# Patient Record
Sex: Female | Born: 1980 | Race: Black or African American | Hispanic: No | Marital: Single | Smoking: Current every day smoker
Health system: Southern US, Community
[De-identification: ages and names within clinical notes are randomized; demographics above are authoritative.]

## PROBLEM LIST (undated history)

## (undated) DIAGNOSIS — F419 Anxiety disorder, unspecified: Secondary | ICD-10-CM

## (undated) DIAGNOSIS — T7840XA Allergy, unspecified, initial encounter: Secondary | ICD-10-CM

## (undated) HISTORY — PX: TONSILLECTOMY AND ADENOIDECTOMY: SUR1326

## (undated) HISTORY — PX: WISDOM TOOTH EXTRACTION: SHX21

## (undated) HISTORY — DX: Allergy, unspecified, initial encounter: T78.40XA

---
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
## 2007-05-10 ENCOUNTER — Observation Stay: Payer: Self-pay | Admitting: Obstetrics and Gynecology

## 2007-05-10 ENCOUNTER — Ambulatory Visit: Payer: Self-pay | Admitting: Obstetrics and Gynecology

## 2007-05-12 ENCOUNTER — Inpatient Hospital Stay: Payer: Self-pay | Admitting: Obstetrics and Gynecology

## 2007-05-24 ENCOUNTER — Inpatient Hospital Stay: Payer: Self-pay | Admitting: Obstetrics and Gynecology

## 2016-08-20 LAB — HM PAP SMEAR: HM Pap smear: NEGATIVE

## 2019-04-07 ENCOUNTER — Ambulatory Visit: Payer: BLUE CROSS/BLUE SHIELD | Admitting: Physician Assistant

## 2019-04-07 ENCOUNTER — Encounter: Payer: Self-pay | Admitting: Physician Assistant

## 2019-04-07 ENCOUNTER — Other Ambulatory Visit: Payer: Self-pay

## 2019-04-07 VITALS — BP 105/64 | HR 73 | Temp 97.8°F | Resp 16 | Ht 64.0 in | Wt 175.8 lb

## 2019-04-07 DIAGNOSIS — F419 Anxiety disorder, unspecified: Secondary | ICD-10-CM

## 2019-04-07 DIAGNOSIS — Z1329 Encounter for screening for other suspected endocrine disorder: Secondary | ICD-10-CM

## 2019-04-07 DIAGNOSIS — Z130 Encounter for screening for diseases of the blood and blood-forming organs and certain disorders involving the immune mechanism: Secondary | ICD-10-CM

## 2019-04-07 DIAGNOSIS — Z131 Encounter for screening for diabetes mellitus: Secondary | ICD-10-CM

## 2019-04-07 DIAGNOSIS — E785 Hyperlipidemia, unspecified: Secondary | ICD-10-CM

## 2019-04-07 DIAGNOSIS — R140 Abdominal distension (gaseous): Secondary | ICD-10-CM

## 2019-04-07 DIAGNOSIS — Z114 Encounter for screening for human immunodeficiency virus [HIV]: Secondary | ICD-10-CM

## 2019-04-07 HISTORY — DX: Anxiety disorder, unspecified: F41.9

## 2019-04-07 MED ORDER — SERTRALINE HCL 50 MG PO TABS: 50.0000 mg | ORAL_TABLET | Freq: Every day | ORAL | 0 refills | Status: DC

## 2019-04-07 NOTE — Patient Instructions (Addendum)
Fiber Content in Foods    See the following list for the dietary fiber content of some common foods.  High-fiber foods  High-fiber foods contain 4 grams or more (4g or more) of fiber per serving. They include:  · Artichoke (fresh) -- 1 medium has 10.3g of fiber.  · Baked beans, plain or vegetarian (canned) -- ½ cup has 5.2g of fiber.  · Blackberries or raspberries (fresh) -- ½ cup has 4g of fiber.  · Bran cereal -- ½ cup has 8.6g of fiber.  · Bulgur (cooked) -- ½ cup has 4g of fiber.  · Kidney beans (canned) -- ½ cup has 6.8g of fiber.  · Lentils (cooked) -- ½ cup has 7.8g of fiber.  · Pear (fresh) -- 1 medium has 5.1g of fiber.  · Peas (frozen) -- ½ cup has 4.4g of fiber.  · Pinto beans (canned) -- ½ cup has 5.5g of fiber.  · Pinto beans (dried and cooked) -- ½ cup has 7.7g of fiber.  · Potato with skin (baked) -- 1 medium has 4.4g of fiber.  · Quinoa (cooked) -- ½ cup has 5g of fiber.  · Soybeans (canned, frozen, or fresh) -- ½ cup has 5.1g of fiber.  Moderate-fiber foods  Moderate-fiber foods contain 1-4 grams (1-4g) of fiber per serving. They include:  · Almonds -- 1 oz. has 3.5g of fiber.  · Apple with skin -- 1 medium has 3.3g of fiber.  · Applesauce, sweetened -- ½ cup has 1.5g of fiber.  · Bagel, plain -- one 4-inch (10-cm) bagel has 2g of fiber.  · Banana -- 1 medium has 3.1g of fiber.  · Broccoli (cooked) -- ½ cup has 2.5g of fiber.  · Carrots (cooked) -- ½ cup has 2.3g of fiber.  · Corn (canned or frozen) -- ½ cup has 2.1g of fiber.  · Corn tortilla -- one 6-inch (15-cm) tortilla has 1.5g of fiber.  · Green beans (canned) -- ½ cup has 2g of fiber.  · Instant oatmeal -- ½ cup has about 2g of fiber.  · Long-grain brown rice (cooked) -- 1 cup has 3.5g of fiber.  · Macaroni, enriched (cooked) -- 1 cup has 2.5g of fiber.  · Melon -- 1 cup has 1.4g of fiber.  · Multigrain cereal -- ½ cup has about 2-4g of fiber.  · Orange -- 1 small has 3.1g of fiber.  · Potatoes, mashed -- ½ cup has 1.6g of fiber.  · Raisins  -- 1/4 cup has 1.6g of fiber.  · Squash -- ½ cup has 2.9g of fiber.  · Sunflower seeds -- ¼ cup has 1.1g of fiber.  · Tomato -- 1 medium has 1.5g of fiber.  · Vegetable or soy patty -- 1 has 3.4g of fiber.  · Whole-wheat bread -- 1 slice has 2g of fiber.  · Whole-wheat spaghetti -- ½ cup has 3.2g of fiber.  Low-fiber foods  Low-fiber foods contain less than 1 gram (less than 1g) of fiber per serving. They include:  · Egg -- 1 large.  · Flour tortilla -- one 6-inch (15-cm) tortilla.  · Fruit juice -- ½ cup.  · Lettuce -- 1 cup.  · Meat, poultry, or fish -- 1 oz.  · Milk -- 1 cup.  · Spinach (raw) -- 1 cup.  · White bread -- 1 slice.  · White rice -- ½ cup.  · Yogurt -- ¾ cup.  Actual amounts of fiber in foods may be different depending on   2019 [place].
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
## 2019-04-12 ENCOUNTER — Ambulatory Visit: Payer: Self-pay

## 2019-04-12 DIAGNOSIS — R140 Abdominal distension (gaseous): Secondary | ICD-10-CM

## 2019-04-12 DIAGNOSIS — F419 Anxiety disorder, unspecified: Secondary | ICD-10-CM

## 2019-04-12 NOTE — Chronic Care Management (AMB) (Signed)
  Care Management   Note  04/12/2019 Name: Elizabeth Marks MRN: [id number] DOB: Mar 10, 1981  Elizabeth Marks is a 38 year old female who sees Carles Collet, [place] for primary care. [name] asked the CCM team to consult the patient for care coordination and care management secondary to her need for dietary education and counseling for anxiety. Telephone outreach to patient today to introduce CCM services.  Elizabeth Marks was given information about Care Management services today including:  1. Case Management services include personalized support from designated clinical staff supervised by a physician, including individualized plan of care and coordination with other care providers 2. 24/7 contact phone numbers for assistance for urgent and routine care needs. 3. The patient may stop CCM services at any time (effective at the end of the month) by phone call to the office staff.   Patient agreed to services and verbal consent obtained.     Plan: Telephone appointment scheduled with RN CM 6/23 at 10:00 and with LCSW 6/24 at 1:00  [place]. Elizabeth Rotunda, RN, BSN Nurse Care Coordinator [facility] Care Management [phone]

## 2019-04-14 ENCOUNTER — Telehealth: Payer: Self-pay

## 2019-04-14 NOTE — Telephone Encounter (Signed)
Coronavirus (COVID-19) Are you at risk?  Are you at risk for the Coronavirus (COVID-19)?  To be considered HIGH RISK for Coronavirus (COVID-19), you have to meet the following criteria:  . Traveled to [place], [place], [place], [place] or [place]; or in the [place] to [place], [place], [place], or [place]; and have fever, cough, and shortness of breath within the last 2 weeks of travel OR . Been in close contact with a person diagnosed with COVID-19 within the last 2 weeks and have fever, cough, and shortness of breath . IF YOU DO NOT MEET THESE CRITERIA, YOU ARE CONSIDERED LOW RISK FOR COVID-19.  What to do if you are HIGH RISK for COVID-19?  . If you are having a medical emergency, call 911. . Seek medical care right away. Before you go to a doctor's office, urgent care or emergency department, call ahead and tell them about your recent travel, contact with someone diagnosed with COVID-19, and your symptoms. You should receive instructions from your physician's office regarding next steps of care.  . When you arrive at healthcare provider, tell the healthcare staff immediately you have returned from visiting [place], [place], [place], [place] or [place]; or traveled in the [place] to [place], [place], [place], or [place]; in the last two weeks or you have been in close contact with a person diagnosed with COVID-19 in the last 2 weeks.   . Tell the health care staff about your symptoms: fever, cough and shortness of breath. . After you have been seen by a medical provider, you will be either: o Tested for (COVID-19) and discharged home on quarantine except to seek medical care if symptoms worsen, and asked to  - Stay home and avoid contact with others until you get your results (4-5 days)  - Avoid travel on public transportation if possible (such as bus, train, or airplane) or o Sent to the Emergency Department by EMS for evaluation, COVID-19 testing, and possible  admission depending on your condition and test results.  What to do if you are LOW RISK for COVID-19?  Reduce your risk of any infection by using the same precautions used for avoiding the common cold or flu:  . Wash your hands often with soap and warm water for at least 20 seconds.  If soap and water are not readily available, use an alcohol-based hand sanitizer with at least 60% alcohol.  . If coughing or sneezing, cover your mouth and nose by coughing or sneezing into the elbow areas of your shirt or coat, into a tissue or into your sleeve (not your hands). . Avoid shaking hands with others and consider head nods or verbal greetings only. . Avoid touching your eyes, nose, or mouth with unwashed hands.  . Avoid close contact with people who are sick. . Avoid places or events with large numbers of people in one location, like concerts or sporting events. . Carefully consider travel plans you have or are making. . If you are planning any travel outside or inside the [place], visit the CDC's Travelers' Health webpage for the latest health notices. . If you have some symptoms but not all symptoms, continue to monitor at home and seek medical attention if your symptoms worsen. . If you are having a medical emergency, call 911.   ADDITIONAL HEALTHCARE OPTIONS FOR PATIENTS  [facility] / e-Visit: [email or web address]         [facility]: [phone]  [facility]: [phone]                   [facility]: [phone]   Pre-screen negative, DM.   

## 2019-04-17 ENCOUNTER — Ambulatory Visit: Payer: BLUE CROSS/BLUE SHIELD | Admitting: Certified Nurse Midwife

## 2019-04-17 ENCOUNTER — Other Ambulatory Visit: Payer: Self-pay

## 2019-04-17 ENCOUNTER — Encounter: Payer: Self-pay | Admitting: Certified Nurse Midwife

## 2019-04-17 VITALS — BP 106/68 | HR 62 | Ht 64.0 in | Wt 173.1 lb

## 2019-04-17 DIAGNOSIS — N921 Excessive and frequent menstruation with irregular cycle: Secondary | ICD-10-CM | POA: Diagnosis not present

## 2019-04-17 DIAGNOSIS — R109 Unspecified abdominal pain: Secondary | ICD-10-CM

## 2019-04-17 DIAGNOSIS — R140 Abdominal distension (gaseous): Secondary | ICD-10-CM

## 2019-04-17 DIAGNOSIS — Z975 Presence of (intrauterine) contraceptive device: Secondary | ICD-10-CM | POA: Diagnosis not present

## 2019-04-17 LAB — POCT URINE PREGNANCY: Preg Test, Ur: NEGATIVE

## 2019-04-17 NOTE — Patient Instructions (Signed)
Levonorgestrel intrauterine device (IUD) What is this medicine? LEVONORGESTREL IUD (LEE voe nor jes trel) is a contraceptive (birth control) device. The device is placed inside the uterus by a healthcare professional. It is used to prevent pregnancy. This device can also be used to treat heavy bleeding that occurs during your period. This medicine may be used for other purposes; ask your health care provider or pharmacist if you have questions. COMMON BRAND NAME(S): Minette Headland What should I tell my health care provider before I take this medicine? They need to know if you have any of these conditions: -abnormal Pap smear -cancer of the breast, uterus, or cervix -diabetes -endometritis -genital or pelvic infection now or in the past -have more than one sexual partner or your partner has more than one partner -heart disease -history of an ectopic or tubal pregnancy -immune system problems -IUD in place -liver disease or tumor -problems with blood clots or take blood-thinners -seizures -use intravenous drugs -uterus of unusual shape -vaginal bleeding that has not been explained -an unusual or allergic reaction to levonorgestrel, other hormones, silicone, or polyethylene, medicines, foods, dyes, or preservatives -pregnant or trying to get pregnant -breast-feeding How should I use this medicine? This device is placed inside the uterus by a health care professional. Talk to your pediatrician regarding the use of this medicine in children. Special care may be needed. Overdosage: If you think you have taken too much of this medicine contact a poison control center or emergency room at once. NOTE: This medicine is only for you. Do not share this medicine with others. What if I miss a dose? This does not apply. Depending on the brand of device you have inserted, the device will need to be replaced every 3 to 5 years if you wish to continue using this type of birth control.  What may interact with this medicine? Do not take this medicine with any of the following medications: -amprenavir -bosentan -fosamprenavir This medicine may also interact with the following medications: -aprepitant -armodafinil -barbiturate medicines for inducing sleep or treating seizures -bexarotene -boceprevir -griseofulvin -medicines to treat seizures like carbamazepine, ethotoin, felbamate, oxcarbazepine, phenytoin, topiramate -modafinil -pioglitazone -rifabutin -rifampin -rifapentine -some medicines to treat HIV infection like atazanavir, efavirenz, indinavir, lopinavir, nelfinavir, tipranavir, ritonavir -St. John's wort -warfarin This list may not describe all possible interactions. Give your health care provider a list of all the medicines, herbs, non-prescription drugs, or dietary supplements you use. Also tell them if you smoke, drink alcohol, or use illegal drugs. Some items may interact with your medicine. What should I watch for while using this medicine? Visit your doctor or health care professional for regular check ups. See your doctor if you or your partner has sexual contact with others, becomes HIV positive, or gets a sexual transmitted disease. This product does not protect you against HIV infection (AIDS) or other sexually transmitted diseases. You can check the placement of the IUD yourself by reaching up to the top of your vagina with clean fingers to feel the threads. Do not pull on the threads. It is a good habit to check placement after each menstrual period. Call your doctor right away if you feel more of the IUD than just the threads or if you cannot feel the threads at all. The IUD may come out by itself. You may become pregnant if the device comes out. If you notice that the IUD has come out use a backup birth control method like condoms and call your  health care provider. Using tampons will not change the position of the IUD and are okay to use during your  period. This IUD can be safely scanned with magnetic resonance imaging (MRI) only under specific conditions. Before you have an MRI, tell your healthcare provider that you have an IUD in place, and which type of IUD you have in place. What side effects may I notice from receiving this medicine? Side effects that you should report to your doctor or health care professional as soon as possible: -allergic reactions like skin rash, itching or hives, swelling of the face, lips, or tongue -fever, flu-like symptoms -genital sores -high blood pressure -no menstrual period for 6 weeks during use -pain, swelling, warmth in the leg -pelvic pain or tenderness -severe or sudden headache -signs of pregnancy -stomach cramping -sudden shortness of breath -trouble with balance, talking, or walking -unusual vaginal bleeding, discharge -yellowing of the eyes or skin Side effects that usually do not require medical attention (report to your doctor or health care professional if they continue or are bothersome): -acne -breast pain -change in sex drive or performance -changes in weight -cramping, dizziness, or faintness while the device is being inserted -headache -irregular menstrual bleeding within first 3 to 6 months of use -nausea This list may not describe all possible side effects. Call your doctor for medical advice about side effects. You may report side effects to FDA at 1-800-FDA-1088. Where should I keep my medicine? This does not apply. NOTE: This sheet is a summary. It may not cover all possible information. If you have questions about this medicine, talk to your doctor, pharmacist, or health care provider.  2019 Elsevier/Gold Standard (2016-07-31 14:14:56) Abdominal Bloating When you have abdominal bloating, your abdomen may feel full, tight, or painful. It may also look bigger than normal or swollen (distended). Common causes of abdominal bloating include:  Swallowing air.   Constipation.  Problems digesting food.  Eating too much.  Irritable bowel syndrome. This is a condition that affects the large intestine.  Lactose intolerance. This is an inability to digest lactose, a natural sugar in dairy products.  Celiac disease. This is a condition that affects the ability to digest gluten, a protein found in some grains.  Gastroparesis. This is a condition that slows down the movement of food in the stomach and small intestine. It is more common in people with diabetes mellitus.  Gastroesophageal reflux disease (GERD). This is a digestive condition that makes stomach acid flow back into the esophagus.  Urinary retention. This means that the body is holding onto urine, and the bladder cannot be emptied all the way. Follow these instructions at home: Eating and drinking  Avoid eating too much.  Try not to swallow air while talking or eating.  Avoid eating while lying down.  Avoid these foods and drinks: ? Foods that cause gas, such as broccoli, cabbage, cauliflower, and baked beans. ? Carbonated drinks. ? Hard candy. ? Chewing gum. Medicines  Take over-the-counter and prescription medicines only as told by your health care provider.  Take probiotic medicines. These medicines contain live bacteria or yeasts that can help digestion.  Take coated peppermint oil capsules. Activity  Try to exercise regularly. Exercise may help to relieve bloating that is caused by gas and relieve constipation. General instructions  Keep all follow-up visits as told by your health care provider. This is important. Contact a health care provider if:  You have nausea and vomiting.  You have diarrhea.  You have  abdominal pain.  You have unusual weight loss or weight gain.  You have severe pain, and medicines do not help. Get help right away if:  You have severe chest pain.  You have trouble breathing.  You have shortness of breath.  You have trouble  urinating.  You have darker urine than normal.  You have blood in your stools or have dark, tarry stools. Summary  Abdominal bloating means that the abdomen is swollen.  Common causes of abdominal bloating are swallowing air, constipation, and problems digesting food.  Avoid eating too much and avoid swallowing air.  Avoid foods that cause gas, carbonated drinks, hard candy, and chewing gum. This information is not intended to replace advice given to you by your health care provider. Make sure you discuss any questions you have with your health care provider. Document Released: 11/20/2016 Document Revised: 11/20/2016 Document Reviewed: 11/20/2016 Elsevier Interactive Patient Education  2019 [place]. Abdominal Pain, Adult  Many things can cause belly (abdominal) pain. Most times, belly pain is not dangerous. Many cases of belly pain can be watched and treated at home. Sometimes belly pain is serious, though. Your doctor will try to find the cause of your belly pain. Follow these instructions at home:  Take over-the-counter and prescription medicines only as told by your doctor. Do not take medicines that help you poop (laxatives) unless told to by your doctor.  Drink enough fluid to keep your pee (urine) clear or pale yellow.  Watch your belly pain for any changes.  Keep all follow-up visits as told by your doctor. This is important. Contact a doctor if:  Your belly pain changes or gets worse.  You are not hungry, or you lose weight without trying.  You are having trouble pooping (constipated) or have watery poop (diarrhea) for more than 2-3 days.  You have pain when you pee or poop.  Your belly pain wakes you up at night.  Your pain gets worse with meals, after eating, or with certain foods.  You are throwing up and cannot keep anything down.  You have a fever. Get help right away if:  Your pain does not go away as soon as your doctor says it should.  You  cannot stop throwing up.  Your pain is only in areas of your belly, such as the right side or the left lower part of the belly.  You have bloody or black poop, or poop that looks like tar.  You have very bad pain, cramping, or bloating in your belly.  You have signs of not having enough fluid or water in your body (dehydration), such as: ? Dark pee, very little pee, or no pee. ? Cracked lips. ? Dry mouth. ? Sunken eyes. ? Sleepiness. ? Weakness. This information is not intended to replace advice given to you by your health care provider. Make sure you discuss any questions you have with your health care provider. Document Released: 04/06/2008 Document Revised: 05/08/2016 Document Reviewed: 04/01/2016 Elsevier Interactive Patient Education  2019 [employer].

## 2019-04-17 NOTE — Progress Notes (Signed)
GYN ENCOUNTER NOTE  Subjective:       Elizabeth Marks is a 38 y.o. G73P2002 female is here for gynecologic evaluation of the following issues:  1. Abdominal pain and bloating 2. Question regarding Mirena    Reports intermittent abdominal pain and bloating x two (2) weeks and intermittent vaginal spotting starting approximately three (3) weeks ago. On third Mirena, no history of menses with previous use. Concerned about onset of vaginal bleeding.   Notes mild relief of symptoms with home treatment measures. Last bowel movement Saturday (04/15/19) after use of OTC enema.   Denies difficulty breathing or respiratory distress, chest pain, excessive vaginal bleeding, dysuria, and leg pain or swelling.    Gynecologic History  No LMP recorded. (Menstrual status: IUD).  Contraception: IUD, Mirena placed by [employer].   Last Pap: 2019. Results were: abnormal  Obstetric History  OB History  Gravida Para Term Preterm AB Living  2 2 2  0 0 2  SAB TAB Ectopic Multiple Live Births  0 0 0 0 2    # Outcome Date GA Lbr Len/2nd Weight Sex Delivery Anes PTL Lv  2 Term 05/13/07   7 lb 13 oz (3.544 kg) M CS-LTranv  N LIV     Complications: Meconium aspiration  1 Term 07/11/99   7 lb 9 oz (3.43 kg) M Vag-Spont  N LIV    Past Medical History:  Diagnosis Date  . Allergy   . Anxiety 04/07/2019    Past Surgical History:  Procedure Laterality Date  . CESAREAN SECTION    . TONSILLECTOMY AND ADENOIDECTOMY    . WISDOM TOOTH EXTRACTION      Current Outpatient Medications on File Prior to Visit  Medication Sig Dispense Refill  . sertraline (ZOLOFT) 50 MG tablet Take 1 tablet (50 mg total) by mouth daily. (Patient not taking: Reported on 04/17/2019) 90 tablet 0   No current facility-administered medications on file prior to visit.     Allergies  Allergen Reactions  . Corn-Containing Products     Abdominal pain  . Shellfish Allergy     Abdominal pain    Social History   Socioeconomic History   . Marital status: Single    Spouse name: Not on file  . Number of children: Not on file  . Years of education: Not on file  . Highest education level: Not on file  Occupational History  . Not on file  Social Needs  . Financial resource strain: Not on file  . Food insecurity    Worry: Not on file    Inability: Not on file  . Transportation needs    Medical: Not on file    Non-medical: Not on file  Tobacco Use  . Smoking status: Current Every Day Smoker  . Smokeless tobacco: Never Used  Substance and Sexual Activity  . Alcohol use: Never    Frequency: Never  . Drug use: Never  . Sexual activity: Yes    Birth control/protection: I.U.D.    Comment: Mirena  Lifestyle  . Physical activity    Days per week: Not on file    Minutes per session: Not on file  . Stress: Not on file  Relationships  . Social Herbalist on phone: Not on file    Gets together: Not on file    Attends religious service: Not on file    Active member of club or organization: Not on file    Attends meetings of clubs or organizations: Not  on file    Relationship status: Not on file  . Intimate partner violence    Fear of current or ex partner: Not on file    Emotionally abused: Not on file    Physically abused: Not on file    Forced sexual activity: Not on file  Other Topics Concern  . Not on file  Social History Narrative  . Not on file    Family History  Problem Relation Age of Onset  . Diabetes Mother   . Fibroids Mother   . Cancer Father   . Cancer Sister        uterus  . Anxiety disorder Son   . Kidney disease Maternal Aunt   . Aneurysm Maternal Aunt   . Breast cancer Neg Hx   . Ovarian cancer Neg Hx   . Colon cancer Neg Hx     The following portions of the patient's history were reviewed and updated as appropriate: allergies, current medications, past family history, past medical history, past social history, past surgical history and problem list.  Review of  Systems  ROS negative except as noted above. Information obtained from patient.   Objective:   BP 106/68   Pulse 62   Ht 5\' 4"  (1.626 m)   Wt 173 lb 1.6 oz (78.5 kg)   BMI 29.71 kg/m    CONSTITUTIONAL: Well-developed, well-nourished female in no acute distress.   ABDOMEN: Soft, non distended; Non tender.  No Organomegaly. PELVIC:  External Genitalia: Normal  BUS: Normal  Vagina: Normal  Cervix: Normal, IUD strings present  Uterus: Normal size, shape,consistency, mobile  Adnexa: Normal  MUSCULOSKELETAL: Normal range of motion. No tenderness.  No cyanosis, clubbing, or edema.  Recent Results (from the past 2160 hour(s))  POCT urine pregnancy     Status: None   Collection Time: 04/17/19 10:27 AM  Result Value Ref Range   Preg Test, Ur Negative Negative    Assessment:   1. Breakthrough bleeding associated with intrauterine device (IUD)  - POCT urine pregnancy - Cervicovaginal ancillary only - US PELVIS TRANSVANGINAL NON-OB (TV ONLY); Future - US PELVIS (TRANSABDOMINAL ONLY); Future  2. Abdominal pain, unspecified abdominal location  - US PELVIS TRANSVANGINAL NON-OB (TV ONLY); Future - US PELVIS (TRANSABDOMINAL ONLY); Future  3. Abdominal bloating  - US PELVIS TRANSVANGINAL NON-OB (TV ONLY); Future - US PELVIS (TRANSABDOMINAL ONLY); Future     Plan:   Vaginal swab collected, will contact patient with results.   Reviewed red flag symptoms and when to call.   RTC x 1-2 weeks for ultrasound and results review or sooner if needed.    Diona Fanti, CNM [facility], [facility] 04/17/19 11:22 AM

## 2019-04-17 NOTE — Progress Notes (Signed)
Patient c/o intermittent pelvic pain and abdominal bloating x2 weeks, c/o difficulty with bowel movements, taking Gas X and Tagamet with minimal relief.  Patient has no menses with Mirena but had vaginal spotting 3 weeks ago that lasted 2 days, flow was light.

## 2019-04-19 LAB — CERVICOVAGINAL ANCILLARY ONLY
Bacterial vaginitis: POSITIVE — AB
Candida vaginitis: NEGATIVE
Chlamydia: NEGATIVE
Neisseria Gonorrhea: NEGATIVE
Trichomonas: POSITIVE — AB

## 2019-04-20 ENCOUNTER — Other Ambulatory Visit: Payer: Self-pay

## 2019-04-20 MED ORDER — METRONIDAZOLE 500 MG PO TABS: 500.0000 mg | ORAL_TABLET | Freq: Two times a day (BID) | ORAL | 0 refills | Status: DC

## 2019-04-20 NOTE — Progress Notes (Signed)
Please contact patient, positive for bv and trich. Rx Flagyl 500 mg PO BID x 7 days. Encourage to activate MyChart. Thanks, JML

## 2019-04-25 ENCOUNTER — Ambulatory Visit: Payer: Self-pay

## 2019-04-25 ENCOUNTER — Other Ambulatory Visit: Payer: Self-pay

## 2019-04-25 DIAGNOSIS — R140 Abdominal distension (gaseous): Secondary | ICD-10-CM

## 2019-04-25 NOTE — Chronic Care Management (AMB) (Signed)
  Chronic Care Management   Initial Visit Note  04/25/2019 Name: Elizabeth Marks MRN: [id number] DOB: Jul 20, 1981  Subjective: "I think I had better wait to talk about my dietary needs to control this bloating until we see if it is from my IUD or something else"  Objective:  Assessment: Elizabeth Marks is a 38 year old femalewho sees Carles Collet, [place] for primary care. [name] the CCM team to consult the patient for care coordination and care management secondary to her need for dietary education and counseling for anxiety. [name] agreed to services and initial assessment with RN CM scheduled for today at 10:00.  [name] request to postpone appointment at this time as she is unsure if her bloating is from her IUD or something else. She is scheduled for an GYN ultrasound 04/27/2019. If it appears IUD is in correct place, patient will then be referred to GI. Patient wishes to contact RN CM after her testing/referral as these appointments will determine if she even needs dietary education secondary to chronic abdominal bloating.  Review of patient status, including review of consultants reports, relevant laboratory and other test results, and collaboration with appropriate care team members and the patient's provider was performed as part of comprehensive patient evaluation and provision of chronic care management services.    Plan: patient was provided CCM RN CM contact information and encouraged to call with needs or concerns. She acknolodges appointment with CCM LCSW tomorrow for counseling needs/resources.  Liban Guedes E. Rollene Rotunda, RN, BSN Nurse Care Coordinator [facility] [phone]

## 2019-04-26 ENCOUNTER — Encounter: Payer: Self-pay | Admitting: *Deleted

## 2019-04-26 ENCOUNTER — Telehealth: Payer: Self-pay

## 2019-04-26 ENCOUNTER — Ambulatory Visit: Payer: Self-pay | Admitting: *Deleted

## 2019-04-26 DIAGNOSIS — F419 Anxiety disorder, unspecified: Secondary | ICD-10-CM

## 2019-04-26 DIAGNOSIS — R140 Abdominal distension (gaseous): Secondary | ICD-10-CM

## 2019-04-26 NOTE — Telephone Encounter (Signed)
Coronavirus (COVID-19) Are you at risk?  Are you at risk for the Coronavirus (COVID-19)?  To be considered HIGH RISK for Coronavirus (COVID-19), you have to meet the following criteria:  . Traveled to [place], [place], [place], [place] or [place]; or in the [place] to [place], [place], [place], or [place]; and have fever, cough, and shortness of breath within the last 2 weeks of travel OR . Been in close contact with a person diagnosed with COVID-19 within the last 2 weeks and have fever, cough, and shortness of breath . IF YOU DO NOT MEET THESE CRITERIA, YOU ARE CONSIDERED LOW RISK FOR COVID-19.  What to do if you are HIGH RISK for COVID-19?  Marland Kitchen If you are having a medical emergency, call 911. . Seek medical care right away. Before you go to a doctor's office, urgent care or emergency department, call ahead and tell them about your recent travel, contact with someone diagnosed with COVID-19, and your symptoms. You should receive instructions from your physician's office regarding next steps of care.  . When you arrive at healthcare provider, tell the healthcare staff immediately you have returned from visiting [place], [place], [place], [place] or [place]; or traveled in the [place] to [place], [place], [place], or [place]; in the last two weeks or you have been in close contact with a person diagnosed with COVID-19 in the last 2 weeks.   . Tell the health care staff about your symptoms: fever, cough and shortness of breath. . After you have been seen by a medical provider, you will be either: o Tested for (COVID-19) and discharged home on quarantine except to seek medical care if symptoms worsen, and asked to  - Stay home and avoid contact with others until you get your results (4-5 days)  - Avoid travel on public transportation if possible (such as bus, train, or airplane) or o Sent to the Emergency Department by EMS for evaluation, COVID-19 testing, and possible  admission depending on your condition and test results.  What to do if you are LOW RISK for COVID-19?  Reduce your risk of any infection by using the same precautions used for avoiding the common cold or flu:  Marland Kitchen Wash your hands often with soap and warm water for at least 20 seconds.  If soap and water are not readily available, use an alcohol-based hand sanitizer with at least 60% alcohol.  . If coughing or sneezing, cover your mouth and nose by coughing or sneezing into the elbow areas of your shirt or coat, into a tissue or into your sleeve (not your hands). . Avoid shaking hands with others and consider head nods or verbal greetings only. . Avoid touching your eyes, nose, or mouth with unwashed hands.  . Avoid close contact with people who are Cherre Kothari. . Avoid places or events with large numbers of people in one location, like concerts or sporting events. . Carefully consider travel plans you have or are making. . If you are planning any travel outside or inside the [place], visit the CDC's Travelers' Health webpage for the latest health notices. . If you have some symptoms but not all symptoms, continue to monitor at home and seek medical attention if your symptoms worsen. . If you are having a medical emergency, call 911.  04/26/19 SCREENING NEG SLS ADDITIONAL HEALTHCARE OPTIONS FOR PATIENTS  [facility] / e-Visit: [email or web address]         [facility]: [phone]  [facility]: [phone]                   [facility]: [phone]  

## 2019-04-27 ENCOUNTER — Ambulatory Visit (INDEPENDENT_AMBULATORY_CARE_PROVIDER_SITE_OTHER): Payer: BLUE CROSS/BLUE SHIELD

## 2019-04-27 ENCOUNTER — Encounter: Payer: Self-pay | Admitting: Certified Nurse Midwife

## 2019-04-27 ENCOUNTER — Ambulatory Visit (INDEPENDENT_AMBULATORY_CARE_PROVIDER_SITE_OTHER): Payer: BLUE CROSS/BLUE SHIELD | Admitting: Certified Nurse Midwife

## 2019-04-27 ENCOUNTER — Other Ambulatory Visit: Payer: Self-pay

## 2019-04-27 VITALS — BP 102/65 | HR 74 | Ht 64.0 in | Wt 173.8 lb

## 2019-04-27 DIAGNOSIS — R140 Abdominal distension (gaseous): Secondary | ICD-10-CM

## 2019-04-27 DIAGNOSIS — Z975 Presence of (intrauterine) contraceptive device: Secondary | ICD-10-CM

## 2019-04-27 DIAGNOSIS — Z8619 Personal history of other infectious and parasitic diseases: Secondary | ICD-10-CM

## 2019-04-27 DIAGNOSIS — R109 Unspecified abdominal pain: Secondary | ICD-10-CM

## 2019-04-27 DIAGNOSIS — N921 Excessive and frequent menstruation with irregular cycle: Secondary | ICD-10-CM | POA: Diagnosis not present
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
## 2019-04-27 NOTE — Progress Notes (Signed)
Patient here to discuss ultrasound report.

## 2019-04-27 NOTE — Patient Instructions (Signed)
Thank you allowing the Chronic Care Management Team to be a part of your care! It was a pleasure speaking with you today!  1. Please call this social worker if  any concerns regarding your mental health arise in the future  CCM (Chronic Care Management) Team   Trish Fountain RN, BSN Nurse Care Coordinator  [phone]  Ruben Reason PharmD  Clinical Pharmacist  [phone]   [place], [place] Worker [phone]  Goals Addressed   None      The patient verbalized understanding of instructions provided today and declined a print copy of patient instruction materials.   No further follow up required: patient to call this social worker if there are any mental health concerns in the future

## 2019-04-27 NOTE — Progress Notes (Signed)
GYN ENCOUNTER NOTE  Subjective:       Elizabeth Marks is a 38 y.o. G71P2002 female here for ultrasound and results review.   Originally seen on 04/17/2019 for pelvic pain and breakthrough bleeding with IUD in place.   Reports relief of symptoms since using OTC enema.   Denies difficulty breathing or respiratory distress, chest pain, abdominal pain, vaginal bleeding, dysuria, and leg pain or swelling.    Gynecologic History  No LMP recorded. (Menstrual status: IUD).  Contraception: IUD, Mirena  Last Pap: 2018-2019. Results were: abnormal per patient  Obstetric History  OB History  Gravida Para Term Preterm AB Living  2 2 2  0 0 2  SAB TAB Ectopic Multiple Live Births  0 0 0 0 2    # Outcome Date GA Lbr Len/2nd Weight Sex Delivery Anes PTL Lv  2 Term 05/13/07   7 lb 13 oz (3.544 kg) M CS-LTranv Spinal N LIV     Complications: Meconium aspiration  1 Term 07/11/99   7 lb 9 oz (3.43 kg) M Vag-Spont Local N LIV    Past Medical History:  Diagnosis Date  . Allergy   . Anxiety 04/07/2019    Past Surgical History:  Procedure Laterality Date  . CESAREAN SECTION    . TONSILLECTOMY AND ADENOIDECTOMY    . WISDOM TOOTH EXTRACTION      Current Outpatient Medications on File Prior to Visit  Medication Sig Dispense Refill  . levonorgestrel (MIRENA) 20 MCG/24HR IUD 1 each by Intrauterine route once.    . sertraline (ZOLOFT) 50 MG tablet Take 1 tablet (50 mg total) by mouth daily. 90 tablet 0   No current facility-administered medications on file prior to visit.     Allergies  Allergen Reactions  . Corn-Containing Products     Abdominal pain  . Shellfish Allergy     Abdominal pain    Social History   Socioeconomic History  . Marital status: Single    Spouse name: Not on file  . Number of children: 2  . Years of education: 45  . Highest education level: 12th grade  Occupational History  . Not on file  Social Needs  . Financial resource strain: Not on file  . Food  insecurity    Worry: Not on file    Inability: Not on file  . Transportation needs    Medical: Not on file    Non-medical: Not on file  Tobacco Use  . Smoking status: Current Every Day Smoker  . Smokeless tobacco: Never Used  Substance and Sexual Activity  . Alcohol use: Yes    Alcohol/week: 1.0 standard drinks    Types: 1 Standard drinks or equivalent per week    Frequency: Never  . Drug use: Yes    Types: Other-see comments    Comment: CBD  . Sexual activity: Yes    Birth control/protection: I.U.D.    Comment: Mirena  Lifestyle  . Physical activity    Days per week: 5 days    Minutes per session: 30 min  . Stress: Not at all  Relationships  . Social connections    Talks on phone: More than three times a week    Gets together: More than three times a week    Attends religious service: Not on file    Active member of club or organization: Yes    Attends meetings of clubs or organizations: More than 4 times per year    Relationship status: Never married  .  Intimate partner violence    Fear of current or ex partner: No    Emotionally abused: No    Physically abused: No    Forced sexual activity: No  Other Topics Concern  . Not on file  Social History Narrative  . Not on file    Family History  Problem Relation Age of Onset  . Diabetes Mother   . Fibroids Mother   . Cancer Father   . Cancer Sister        uterus  . Anxiety disorder Son   . Kidney disease Maternal Aunt   . Aneurysm Maternal Aunt   . Breast cancer Neg Hx   . Ovarian cancer Neg Hx   . Colon cancer Neg Hx     The following portions of the patient's history were reviewed and updated as appropriate: allergies, current medications, past family history, past medical history, past social history, past surgical history and problem list.  Review of Systems  ROS negative except as noted above. Information obtained from patient.   Objective:   BP 102/65   Pulse 74   Ht 5\' 4"  (1.626 m)   Wt 173 lb  12.8 oz (78.8 kg)   BMI 29.83 kg/m    CONSTITUTIONAL: Well-developed, well-nourished female in no acute distress.   Recent Results (from the past 2160 hour(s))  Cervicovaginal ancillary only     Status: Abnormal   Collection Time: 04/17/19 12:00 AM  Result Value Ref Range   Bacterial vaginitis **POSITIVE for Gardnerella vaginalis** (A)     Comment: Normal Reference Range - Negative   Candida vaginitis Negative for Candida species     Comment: Normal Reference Range - Negative   Chlamydia Negative     Comment: Normal Reference Range - Negative   Neisseria gonorrhea Negative     Comment: Normal Reference Range - Negative   Trichomonas **POSITIVE** (A)     Comment: Normal Reference Range - Negative  POCT urine pregnancy     Status: None   Collection Time: 04/17/19 10:27 AM  Result Value Ref Range   Preg Test, Ur Negative Negative    ULTRASOUND REPORT  Location: [facility]  Date of Service: 04/27/2019     Indications:Pelvic Pain Findings:  The uterus is anteverted and measures 6.8 x 3.2 x 4.8 cm. Echo texture is homogenous without evidence of focal masses.IUD is centrally located.  The Endometrium measures 2 mm.  Right Ovary measures 2.9 x 1.7 x 2.0 cm. It is normal in appearance. Left Ovary measures 2.3 x 1.5 x 1.8 cm. It is normal in appearance. Survey of the adnexa demonstrates no adnexal masses. There is no free fluid in the cul de sac.  Impression: 1. IUD is seen centrally located in the endometrium.  Recommendations: 1.Clinical correlation with the patient's History and Physical Exam.   Assessment:   1. IUD (intrauterine device) in place  2. Bacterial vaginosis and Trichomonas   Plan:   Ultrasound findings and lab results discussed with patient. Verbalized understanding.   Reviewed red flag symptoms and when to call.   Encouraged to scheduled ANNUAL EXAM and PAP.    Diona Fanti, CNM [facility], [facility] 04/27/19 11:50  AM

## 2019-04-27 NOTE — Patient Instructions (Signed)

## 2019-05-03 ENCOUNTER — Telehealth: Payer: Self-pay

## 2019-05-03 ENCOUNTER — Telehealth: Payer: Self-pay | Admitting: Certified Nurse Midwife

## 2019-05-03 MED ORDER — MAGIC MOUTHWASH W/LIDOCAINE: 5.0000 mL | Freq: Four times a day (QID) | ORAL | 0 refills | Status: DC

## 2019-05-03 NOTE — Telephone Encounter (Signed)
Patient wants to get Rx for thrush.  Was given abx on last visit.   [facility]  CB#  [phone]

## 2019-05-03 NOTE — Telephone Encounter (Signed)
Patient called stating the medication she is taking for BV has given her thrush and she needs something sent in. Please Advise.

## 2019-05-03 NOTE — Telephone Encounter (Signed)
Magic mouthwash script printed awaiting sig.

## 2019-05-03 NOTE — Telephone Encounter (Signed)
I did not give her abx, she got it from [facility]. She should contact them.

## 2019-05-03 NOTE — Telephone Encounter (Signed)
Please advise 

## 2019-05-03 NOTE — Telephone Encounter (Signed)
Patient advised as below.  

## 2019-05-10 ENCOUNTER — Telehealth: Payer: Self-pay

## 2019-05-10 NOTE — Telephone Encounter (Signed)
Pt prescreened. No symptoms.   Coronavirus (COVID-19) Are you at risk?  Are you at risk for the Coronavirus (COVID-19)?  To be considered HIGH RISK for Coronavirus (COVID-19), you have to meet the following criteria:  . Traveled to [place], [place], [place], [place] or [place]; or in the [place] to [place], [place], [place], or [place]; and have fever, cough, and shortness of breath within the last 2 weeks of travel OR . Been in close contact with a person diagnosed with COVID-19 within the last 2 weeks and have fever, cough, and shortness of breath . IF YOU DO NOT MEET THESE CRITERIA, YOU ARE CONSIDERED LOW RISK FOR COVID-19.  What to do if you are HIGH RISK for COVID-19?  Marland Kitchen If you are having a medical emergency, call 911. . Seek medical care right away. Before you go to a doctor's office, urgent care or emergency department, call ahead and tell them about your recent travel, contact with someone diagnosed with COVID-19, and your symptoms. You should receive instructions from your physician's office regarding next steps of care.  . When you arrive at healthcare provider, tell the healthcare staff immediately you have returned from visiting [place], [place], [place], [place] or [place]; or traveled in the [place] to [place], [place], [place], or [place]; in the last two weeks or you have been in close contact with a person diagnosed with COVID-19 in the last 2 weeks.   . Tell the health care staff about your symptoms: fever, cough and shortness of breath. . After you have been seen by a medical provider, you will be either: o Tested for (COVID-19) and discharged home on quarantine except to seek medical care if symptoms worsen, and asked to  - Stay home and avoid contact with others until you get your results (4-5 days)  - Avoid travel on public transportation if possible (such as bus, train, or airplane) or o Sent to the Emergency Department by EMS for evaluation,  COVID-19 testing, and possible admission depending on your condition and test results.  What to do if you are LOW RISK for COVID-19?  Reduce your risk of any infection by using the same precautions used for avoiding the common cold or flu:  Marland Kitchen Wash your hands often with soap and warm water for at least 20 seconds.  If soap and water are not readily available, use an alcohol-based hand sanitizer with at least 60% alcohol.  . If coughing or sneezing, cover your mouth and nose by coughing or sneezing into the elbow areas of your shirt or coat, into a tissue or into your sleeve (not your hands). . Avoid shaking hands with others and consider head nods or verbal greetings only. . Avoid touching your eyes, nose, or mouth with unwashed hands.  . Avoid close contact with people who are sick. . Avoid places or events with large numbers of people in one location, like concerts or sporting events. . Carefully consider travel plans you have or are making. . If you are planning any travel outside or inside the [place], visit the CDC's Travelers' Health webpage for the latest health notices. . If you have some symptoms but not all symptoms, continue to monitor at home and seek medical attention if your symptoms worsen. . If you are having a medical emergency, call 911.   [place] / e-Visit: [email or web address]         [facility]: [facility]: [facility]: [phone]

## 2019-05-10 NOTE — Progress Notes (Signed)
Pt is present for annual exam. Pt stated that she still has the yeast infection and has white areas on her tongue.

## 2019-05-10 NOTE — Patient Instructions (Addendum)
Preventive Care 20-38 Years Old, Female Preventive care refers to visits with your health care provider and lifestyle choices that can promote health and wellness. This includes:  A yearly physical exam. This may also be called an annual well check.  Regular dental visits and eye exams.  Immunizations.  Screening for certain conditions.  Healthy lifestyle choices, such as eating a healthy diet, getting regular exercise, not using drugs or products that contain nicotine and tobacco, and limiting alcohol use. What can I expect for my preventive care visit? Physical exam Your health care provider will check your:  Height and weight. This may be used to calculate body mass index (BMI), which tells if you are at a healthy weight.  Heart rate and blood pressure.  Skin for abnormal spots. Counseling Your health care provider may ask you questions about your:  Alcohol, tobacco, and drug use.  Emotional well-being.  Home and relationship well-being.  Sexual activity.  Eating habits.  Work and work Statistician.  Method of birth control.  Menstrual cycle.  Pregnancy history. What immunizations do I need?  Influenza (flu) vaccine  This is recommended every year. Tetanus, diphtheria, and pertussis (Tdap) vaccine  You may need a Td booster every 10 years. Varicella (chickenpox) vaccine  You may need this if you have not been vaccinated. Human papillomavirus (HPV) vaccine  If recommended by your health care provider, you may need three doses over 6 months. Measles, mumps, and rubella (MMR) vaccine  You may need at least one dose of MMR. You may also need a second dose. Meningococcal conjugate (MenACWY) vaccine  One dose is recommended if you are age 75-21 years and a first-year college student living in a residence hall, or if you have one of several medical conditions. You may also need additional booster doses. Pneumococcal conjugate (PCV13) vaccine  You may need  this if you have certain conditions and were not previously vaccinated. Pneumococcal polysaccharide (PPSV23) vaccine  You may need one or two doses if you smoke cigarettes or if you have certain conditions. Hepatitis A vaccine  You may need this if you have certain conditions or if you travel or work in places where you may be exposed to hepatitis A. Hepatitis B vaccine  You may need this if you have certain conditions or if you travel or work in places where you may be exposed to hepatitis B. Haemophilus influenzae type b (Hib) vaccine  You may need this if you have certain conditions. You may receive vaccines as individual doses or as more than one vaccine together in one shot (combination vaccines). Talk with your health care provider about the risks and benefits of combination vaccines. What tests do I need?  Blood tests  Lipid and cholesterol levels. These may be checked every 5 years starting at age 33.  Hepatitis C test.  Hepatitis B test. Screening  Diabetes screening. This is done by checking your blood sugar (glucose) after you have not eaten for a while (fasting).  Sexually transmitted disease (STD) testing.  BRCA-related cancer screening. This may be done if you have a family history of breast, ovarian, tubal, or peritoneal cancers.  Pelvic exam and Pap test. This may be done every 3 years starting at age 76. Starting at age 102, this may be done every 5 years if you have a Pap test in combination with an HPV test. Talk with your health care provider about your test results, treatment options, and if necessary, the need for more tests.  Follow these instructions at home: °Eating and drinking ° °· Eat a diet that includes fresh fruits and vegetables, whole grains, lean protein, and low-fat dairy. °· Take vitamin and mineral supplements as recommended by your health care provider. °· Do not drink alcohol if: °? Your health care provider tells you not to drink. °? You are  pregnant, may be pregnant, or are planning to become pregnant. °· If you drink alcohol: °? Limit how much you have to 0-1 drink a day. °? Be aware of how much alcohol is in your drink. In the [place] one drink equals one 12 oz bottle of beer (355 mL), one 5 oz glass of wine (148 mL), or one 1½ oz glass of hard liquor (44 mL). °Lifestyle °· Take daily care of your teeth and gums. °· Stay active. Exercise for at least 30 minutes on 5 or more days each week. °· Do not use any products that contain nicotine or tobacco, such as cigarettes, e-cigarettes, and chewing tobacco. If you need help quitting, ask your health care provider. °· If you are sexually active, practice safe sex. Use a condom or other form of birth control (contraception) in order to prevent pregnancy and STIs (sexually transmitted infections). If you plan to become pregnant, see your health care provider for a preconception visit. °What's next? °· Visit your health care provider once a year for a well check visit. °· Ask your health care provider how often you should have your eyes and teeth checked. °· Stay up to date on all vaccines. °This information is not intended to replace advice given to you by your health care provider. Make sure you discuss any questions you have with your health care provider. °Document Released: 12/15/2001 Document Revised: 06/30/2018 Document Reviewed: 06/30/2018 °Elsevier Patient Education © 2020 Elsevier Inc. °Breast Self-Awareness °Breast self-awareness is knowing how your breasts look and feel. Doing breast self-awareness is important. It allows you to catch a breast problem early while it is still small and can be treated. All women should do breast self-awareness, including women who have had breast implants. Tell your doctor if you notice a change in your breasts. °What you need: °· A mirror. °· A well-lit room. °How to do a breast self-exam °A breast self-exam is one way to learn what is normal for your breasts and to  check for changes. To do a breast self-exam: °Look for changes ° °1. Take off all the clothes above your waist. °2. Stand in front of a mirror in a room with good lighting. °3. Put your hands on your hips. °4. Push your hands down. °5. Look at your breasts and nipples in the mirror to see if one breast or nipple looks different from the other. Check to see if: °? The shape of one breast is different. °? The size of one breast is different. °? There are wrinkles, dips, and bumps in one breast and not the other. °6. Look at each breast for changes in the skin, such as: °? Redness. °? Scaly areas. °7. Look for changes in your nipples, such as: °? Liquid around the nipples. °? Bleeding. °? Dimpling. °? Redness. °? A change in where the nipples are. °Feel for changes ° °1. Lie on your back on the floor. °2. Feel each breast. To do this, follow these steps: °? Pick a breast to feel. °? Put the arm closest to that breast above your head. °? Use your other arm to feel the nipple area of   your breast. Feel the area with the pads of your three middle fingers by making small circles with your fingers. For the first circle, press lightly. For the second circle, press harder. For the third circle, press even harder. ? Keep making circles with your fingers at the different pressures as you move down your breast. Stop when you feel your ribs. ? Move your fingers a little toward the center of your body. ? Start making circles with your fingers again, this time going up until you reach your collarbone. ? Keep making up-and-down circles until you reach your armpit. Remember to keep using the three pressures. ? Feel the other breast in the same way. 3. Sit or stand in the tub or shower. 4. With soapy water on your skin, feel each breast the same way you did in step 2 when you were lying on the floor. Write down what you find Writing down what you find can help you remember what to tell your doctor. Write down:  What is  normal for each breast.  Any changes you find in each breast, including: ? The kind of changes you find. ? Whether you have pain. ? Size and location of any lumps.  When you last had your menstrual period. General tips  Check your breasts every month.  If you are breastfeeding, the best time to check your breasts is after you feed your baby or after you use a breast pump.  If you get menstrual periods, the best time to check your breasts is 5-7 days after your menstrual period is over.  With time, you will become comfortable with the self-exam, and you will begin to know if there are changes in your breasts. Contact a doctor if you:  See a change in the shape or size of your breasts or nipples.  See a change in the skin of your breast or nipples, such as red or scaly skin.  Have fluid coming from your nipples that is not normal.  Find a lump or thick area that was not there before.  Have pain in your breasts.  Have any concerns about your breast health. Summary  Breast self-awareness includes looking for changes in your breasts, as well as feeling for changes within your breasts.  Breast self-awareness should be done in front of a mirror in a well-lit room.  You should check your breasts every month. If you get menstrual periods, the best time to check your breasts is 5-7 days after your menstrual period is over.  Let your doctor know of any changes you see in your breasts, including changes in size, changes on the skin, pain or tenderness, or fluid from your nipples that is not normal. This information is not intended to replace advice given to you by your health care provider. Make sure you discuss any questions you have with your health care provider. Document Released: 04/06/2008 Document Revised: 06/07/2018 Document Reviewed: 06/07/2018 Elsevier Patient Education  [place]. Levonorgestrel intrauterine device (IUD) What is this medicine? LEVONORGESTREL IUD  (LEE voe nor jes trel) is a contraceptive (birth control) device. The device is placed inside the uterus by a healthcare professional. It is used to prevent pregnancy. This device can also be used to treat heavy bleeding that occurs during your period. This medicine may be used for other purposes; ask your health care provider or pharmacist if you have questions. COMMON BRAND NAME(S): Minette [place]land What should I tell my health care provider before I  take this medicine? They need to know if you have any of these conditions:  abnormal Pap smear  cancer of the breast, uterus, or cervix  diabetes  endometritis  genital or pelvic infection now or in the past  have more than one sexual partner or your partner has more than one partner  heart disease  history of an ectopic or tubal pregnancy  immune system problems  IUD in place  liver disease or tumor  problems with blood clots or take blood-thinners  seizures  use intravenous drugs  uterus of unusual shape  vaginal bleeding that has not been explained  an unusual or allergic reaction to levonorgestrel, other hormones, silicone, or polyethylene, medicines, foods, dyes, or preservatives  pregnant or trying to get pregnant  breast-feeding How should I use this medicine? This device is placed inside the uterus by a health care professional. Talk to your pediatrician regarding the use of this medicine in children. Special care may be needed. Overdosage: If you think you have taken too much of this medicine contact a poison control center or emergency room at once. NOTE: This medicine is only for you. Do not share this medicine with others. What if I miss a dose? This does not apply. Depending on the brand of device you have inserted, the device will need to be replaced every 3 to 6 years if you wish to continue using this type of birth control. What may interact with this medicine? Do not take this medicine  with any of the following medications:  amprenavir  bosentan  fosamprenavir This medicine may also interact with the following medications:  aprepitant  armodafinil  barbiturate medicines for inducing sleep or treating seizures  bexarotene  boceprevir  griseofulvin  medicines to treat seizures like carbamazepine, ethotoin, felbamate, oxcarbazepine, phenytoin, topiramate  modafinil  pioglitazone  rifabutin  rifampin  rifapentine  some medicines to treat HIV infection like atazanavir, efavirenz, indinavir, lopinavir, nelfinavir, tipranavir, ritonavir  St. John's wort  warfarin This list may not describe all possible interactions. Give your health care provider a list of all the medicines, herbs, non-prescription drugs, or dietary supplements you use. Also tell them if you smoke, drink alcohol, or use illegal drugs. Some items may interact with your medicine. What should I watch for while using this medicine? Visit your doctor or health care professional for regular check ups. See your doctor if you or your partner has sexual contact with others, becomes HIV positive, or gets a sexual transmitted disease. This product does not protect you against HIV infection (AIDS) or other sexually transmitted diseases. You can check the placement of the IUD yourself by reaching up to the top of your vagina with clean fingers to feel the threads. Do not pull on the threads. It is a good habit to check placement after each menstrual period. Call your doctor right away if you feel more of the IUD than just the threads or if you cannot feel the threads at all. The IUD may come out by itself. You may become pregnant if the device comes out. If you notice that the IUD has come out use a backup birth control method like condoms and call your health care provider. Using tampons will not change the position of the IUD and are okay to use during your period. This IUD can be safely scanned with  magnetic resonance imaging (MRI) only under specific conditions. Before you have an MRI, tell your healthcare provider that you have an IUD in  place, and which type of IUD you have in place. What side effects may I notice from receiving this medicine? Side effects that you should report to your doctor or health care professional as soon as possible:  allergic reactions like skin rash, itching or hives, swelling of the face, lips, or tongue  fever, flu-like symptoms  genital sores  high blood pressure  no menstrual period for 6 weeks during use  pain, swelling, warmth in the leg  pelvic pain or tenderness  severe or sudden headache  signs of pregnancy  stomach cramping  sudden shortness of breath  trouble with balance, talking, or walking  unusual vaginal bleeding, discharge  yellowing of the eyes or skin Side effects that usually do not require medical attention (report to your doctor or health care professional if they continue or are bothersome):  acne  breast pain  change in sex drive or performance  changes in weight  cramping, dizziness, or faintness while the device is being inserted  headache  irregular menstrual bleeding within first 3 to 6 months of use  nausea This list may not describe all possible side effects. Call your doctor for medical advice about side effects. You may report side effects to FDA at 1-800-FDA-1088. Where should I keep my medicine? This does not apply. NOTE: This sheet is a summary. It may not cover all possible information. If you have questions about this medicine, talk to your doctor, pharmacist, or health care provider.  2020 Elsevier/Gold Standard (2018-08-30 13:22:01)

## 2019-05-11 ENCOUNTER — Ambulatory Visit (INDEPENDENT_AMBULATORY_CARE_PROVIDER_SITE_OTHER): Payer: BLUE CROSS/BLUE SHIELD | Admitting: Certified Nurse Midwife

## 2019-05-11 ENCOUNTER — Encounter: Payer: Self-pay | Admitting: Certified Nurse Midwife

## 2019-05-11 ENCOUNTER — Other Ambulatory Visit: Payer: Self-pay

## 2019-05-11 ENCOUNTER — Other Ambulatory Visit (HOSPITAL_COMMUNITY)
Admission: RE | Admit: 2019-05-11 | Discharge: 2019-05-11 | Disposition: A | Discharge status: Home / Self Care | Payer: BLUE CROSS/BLUE SHIELD | Source: Ambulatory Visit | Attending: Certified Nurse Midwife | Admitting: Certified Nurse Midwife

## 2019-05-11 VITALS — BP 106/71 | HR 66 | Ht 64.0 in | Wt 168.9 lb

## 2019-05-11 DIAGNOSIS — Z975 Presence of (intrauterine) contraceptive device: Secondary | ICD-10-CM | POA: Insufficient documentation

## 2019-05-11 DIAGNOSIS — Z124 Encounter for screening for malignant neoplasm of cervix: Secondary | ICD-10-CM | POA: Insufficient documentation

## 2019-05-11 DIAGNOSIS — Z01419 Encounter for gynecological examination (general) (routine) without abnormal findings: Secondary | ICD-10-CM | POA: Insufficient documentation

## 2019-05-11 DIAGNOSIS — N643 Galactorrhea not associated with childbirth: Secondary | ICD-10-CM

## 2019-05-11 DIAGNOSIS — B370 Candidal stomatitis: Secondary | ICD-10-CM

## 2019-05-11 DIAGNOSIS — Z8742 Personal history of other diseases of the female genital tract: Secondary | ICD-10-CM

## 2019-05-11 DIAGNOSIS — D1779 Benign lipomatous neoplasm of other sites: Secondary | ICD-10-CM

## 2019-05-11 DIAGNOSIS — Z8619 Personal history of other infectious and parasitic diseases: Secondary | ICD-10-CM

## 2019-05-11 DIAGNOSIS — R1909 Other intra-abdominal and pelvic swelling, mass and lump: Secondary | ICD-10-CM

## 2019-05-11 MED ORDER — FLUCONAZOLE 150 MG PO TABS: 150.0000 mg | ORAL_TABLET | ORAL | 0 refills | Status: DC

## 2019-05-11 NOTE — Progress Notes (Signed)
ANNUAL PREVENTATIVE CARE GYN  ENCOUNTER NOTE  Subjective:       Elizabeth Marks is a 38 y.o. G20P2002 female here for a routine annual gynecologic exam.  Current complaints: 1. Oral and vaginal yeast infection 2. 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 Internal "cyst" under cesarean section incision, numbness, and mass at end of scar  Denies difficulty breathing or respiratory distress, chest pain, abdominal pain, excessive vaginal bleeding, dysuria, and leg pain or swelling.    Gynecologic History  No LMP recorded. (Menstrual status: IUD).  Contraception: IUD, Mirena  Last Pap: 2018-2019. Results were: abnormal per patient  Obstetric History  OB History  Gravida Para Term Preterm AB Living  2 2 2  0 0 2  SAB TAB Ectopic Multiple Live Births  0 0 0 0 2    # Outcome Date GA Lbr Len/2nd Weight Sex Delivery Anes PTL Lv  2 Term 05/13/07   7 lb 13 oz (3.544 kg) M CS-LTranv Spinal N LIV     Complications: Meconium aspiration  1 Term 07/11/99   7 lb 9 oz (3.43 kg) M Vag-Spont Local N LIV    Past Medical History:  Diagnosis Date  . Allergy   . Anxiety 04/07/2019    Past Surgical History:  Procedure Laterality Date  . CESAREAN SECTION    . TONSILLECTOMY AND ADENOIDECTOMY    . WISDOM TOOTH EXTRACTION      Current Outpatient Medications on File Prior to Visit  Medication Sig Dispense Refill  . levonorgestrel (MIRENA) 20 MCG/24HR IUD 1 each by Intrauterine route once.    . sertraline (ZOLOFT) 50 MG tablet Take 1 tablet (50 mg total) by mouth daily. 90 tablet 0  . magic mouthwash w/lidocaine SOLN Take 5 mLs by mouth 4 (four) times daily. Swish and spit. DO NOT SWALLOW (Patient not taking: Reported on 05/11/2019) 30 mL 0   No current facility-administered medications on file prior to visit.     Allergies  Allergen Reactions  . Corn-Containing Products     Abdominal pain  . Shellfish Allergy     Abdominal pain    Social History   Socioeconomic  History  . Marital status: Single    Spouse name: Not on file  . Number of children: 2  . Years of education: 34  . Highest education level: 12th grade  Occupational History  . Not on file  Social Needs  . Financial resource strain: Not on file  . Food insecurity    Worry: Not on file    Inability: Not on file  . Transportation needs    Medical: Not on file    Non-medical: Not on file  Tobacco Use  . Smoking status: Current Every Day Smoker  . Smokeless tobacco: Never Used  Substance and Sexual Activity  . Alcohol use: Yes    Alcohol/week: 1.0 standard drinks    Types: 1 Standard drinks or equivalent per week    Frequency: Never  . Drug use: Yes    Types: Other-see comments    Comment: CBD  . Sexual activity: Yes    Birth control/protection: I.U.D.    Comment: Mirena  Lifestyle  . Physical activity    Days per week: 5 days    Minutes per session: 30 min  . Stress: Not at all  Relationships  . Social connections    Talks on phone: More than three times a week    Gets together: More than three times  a week    Attends religious service: Not on file    Active member of club or organization: Yes    Attends meetings of clubs or organizations: More than 4 times per year    Relationship status: Never married  . Intimate partner violence    Fear of current or ex partner: No    Emotionally abused: No    Physically abused: No    Forced sexual activity: No  Other Topics Concern  . Not on file  Social History Narrative  . Not on file    Family History  Problem Relation Age of Onset  . Diabetes Mother   . Fibroids Mother   . Cancer Father   . Cancer Sister        uterus  . Anxiety disorder Son   . Kidney disease Maternal Aunt   . Aneurysm Maternal Aunt   . Breast cancer Neg Hx   . Ovarian cancer Neg Hx   . Colon cancer Neg Hx     The following portions of the patient's history were reviewed and updated as appropriate: allergies, current medications, past family  history, past medical history, past social history, past surgical history and problem list.  Review of Systems  ROS negative except as noted above. Information obtained from patient.    Objective:   BP 106/71   Pulse 66   Ht 5\' 4"  (1.626 m)   Wt 168 lb 14.4 oz (76.6 kg)   BMI 28.99 kg/m    CONSTITUTIONAL: Well-developed, well-nourished female in no acute distress.   PSYCHIATRIC: Normal mood and affect. Normal behavior. Normal judgment and thought content.  [place]: Alert and oriented to person, place, and time. Normal muscle tone coordination. No cranial nerve deficit noted.  HENT:  Normocephalic, atraumatic, External right and left ear normal. Oropharynx is clear and moist  EYES: Conjunctivae and EOM are normal. Pupils are equal and round.    NECK: Normal range of motion, supple, no masses.  Normal thyroid.   SKIN: Skin is warm and dry. No rash noted. Not diaphoretic. No erythema. No pallor.  CARDIOVASCULAR: Normal heart rate noted, regular rhythm, no murmur.  RESPIRATORY: Clear to auscultation bilaterally. Effort and breath sounds normal, no problems with respiration noted.  BREASTS: Symmetric in size. No masses, skin changes, nipple drainage, or lymphadenopathy.  ABDOMEN: Soft, normal bowel sounds, no distention noted.  No tenderness, rebound or guarding. Chain of small, round mobile, dime size masses located under cesarean section scar. Pedunuclated mass/lipoma to right end of incision scar.   PELVIC:   External Genitalia: Normal  Vagina: Thick, white discharge present  Cervix: Normal, IUD strings present, Pap collected  Uterus: Normal  Adnexa: Normal  MUSCULOSKELETAL: Normal range of motion. No tenderness.  No cyanosis, clubbing, or edema.  2+ distal pulses.  LYMPHATIC: No Axillary, Supraclavicular, or Inguinal Adenopathy.  Assessment:   Annual gynecologic examination 38 y.o.   Contraception: IUD, Mirena   Overweight   Problem List Items Addressed This  Visit    None    Visit Diagnoses    Encounter for well woman exam with routine gynecological exam    -  Primary   Relevant Orders   Cytology - PAP   Cervicovaginal ancillary only   Screening for cervical cancer       Relevant Orders   Cytology - PAP   IUD (intrauterine device) in place       Relevant Orders   Cytology - PAP   History of trichomoniasis  Relevant Orders   Cervicovaginal ancillary only   History of abnormal cervical Pap smear       Oral thrush       Relevant Medications   fluconazole (DIFLUCAN) 150 MG tablet   Other Relevant Orders   Cervicovaginal ancillary only      Plan:   Pap: Pap Co Test   Labs: See orders   Referral to General Surgery, see orders  Routine preventative health maintenance measures emphasized: Exercise/Diet/Weight control, Tobacco Warnings, Alcohol/Substance use risks and Stress Management; see AVS  Reviewed red flag symptoms and when to call  RTC x 1 year for ANNUAL EXAM or sooner if needed   Diona Fanti, CNM [facility], [facility] 05/11/19 9:37 AM

## 2019-05-15 LAB — CERVICOVAGINAL ANCILLARY ONLY
Bacterial vaginitis: NEGATIVE
Candida vaginitis: NEGATIVE
Trichomonas: NEGATIVE

## 2019-05-16 ENCOUNTER — Encounter: Payer: Self-pay | Admitting: Physician Assistant

## 2019-05-16 ENCOUNTER — Ambulatory Visit (INDEPENDENT_AMBULATORY_CARE_PROVIDER_SITE_OTHER): Payer: BLUE CROSS/BLUE SHIELD | Admitting: Physician Assistant

## 2019-05-16 ENCOUNTER — Other Ambulatory Visit: Payer: Self-pay

## 2019-05-16 VITALS — BP 114/79 | HR 79 | Temp 97.4°F | Ht 64.0 in | Wt 167.8 lb

## 2019-05-16 DIAGNOSIS — K5900 Constipation, unspecified: Secondary | ICD-10-CM

## 2019-05-16 DIAGNOSIS — F419 Anxiety disorder, unspecified: Secondary | ICD-10-CM

## 2019-05-16 DIAGNOSIS — Z716 Tobacco abuse counseling: Secondary | ICD-10-CM | POA: Diagnosis not present

## 2019-05-16 DIAGNOSIS — Z23 Encounter for immunization: Secondary | ICD-10-CM

## 2019-05-16 NOTE — Patient Instructions (Signed)

## 2019-05-16 NOTE — Progress Notes (Signed)
Patient: Elizabeth Marks, Female    DOB: [birth year], 38 y.o.   MRN: [id number] Visit Date: 05/16/2019  Today's Provider: Trinna Post, PA-C   Chief Complaint  Patient presents with  . Annual Exam   Subjective:     Annual physical exam Elizabeth Marks is a 38 y.o. female who presents today for health maintenance and complete physical. She feels fairly well. She reports exercising-walking. She reports she is sleeping fairly well.  Patient actually had annual CPE and PAP with OBGYN last week. Was recently treated for trichomonas.   Taking zoloft and does not feel any different. She will continue to take this, does not want to increase medication.   Continues to feel bloated. Intolerant to taste of miralax. Has not tried fiber supplement.  -----------------------------------------------------------------   Review of Systems  Constitutional: Negative.   HENT: Negative.   Eyes: Negative.   Respiratory: Negative.   Cardiovascular: Negative.   Gastrointestinal: Negative.   Endocrine: Negative.   Genitourinary: Negative.   Musculoskeletal: Negative.   Skin: Negative.   Allergic/Immunologic: Negative.   Neurological: Negative.   Hematological: Negative.   Psychiatric/Behavioral: Positive for sleep disturbance.    Social History      She  reports that she has been smoking. She has never used smokeless tobacco. She reports current alcohol use of about 1.0 standard drinks of alcohol per week. She reports current drug use. Drug: Other-see comments.       Social History   Socioeconomic History  . Marital status: Single    Spouse name: Not on file  . Number of children: 2  . Years of education: 37  . Highest education level: 12th grade  Occupational History  . Not on file  Social Needs  . Financial resource strain: Not on file  . Food insecurity    Worry: Not on file    Inability: Not on file  . Transportation needs    Medical: Not on file   Non-medical: Not on file  Tobacco Use  . Smoking status: Current Every Day Smoker  . Smokeless tobacco: Never Used  Substance and Sexual Activity  . Alcohol use: Yes    Alcohol/week: 1.0 standard drinks    Types: 1 Standard drinks or equivalent per week    Frequency: Never  . Drug use: Yes    Types: Other-see comments    Comment: CBD  . Sexual activity: Yes    Birth control/protection: I.U.D.    Comment: Mirena  Lifestyle  . Physical activity    Days per week: 5 days    Minutes per session: 30 min  . Stress: Not at all  Relationships  . Social connections    Talks on phone: More than three times a week    Gets together: More than three times a week    Attends religious service: Not on file    Active member of club or organization: Yes    Attends meetings of clubs or organizations: More than 4 times per year    Relationship status: Never married  Other Topics Concern  . Not on file  Social History Narrative  . Not on file    Past Medical History:  Diagnosis Date  . Allergy   . Anxiety 04/07/2019     Patient Active Problem List   Diagnosis Date Noted  . Anxiety 04/07/2019  . Bloating 04/07/2019    Past Surgical History:  Procedure Laterality Date  . CESAREAN SECTION    .  TONSILLECTOMY AND ADENOIDECTOMY    . WISDOM TOOTH EXTRACTION      Family History        Family Status  Relation Name Status  . Mother  Alive  . Father  Alive  . Sister  Alive  . Son  Alive  . Mat Aunt  (Not Specified)  . Neg Hx  (Not Specified)        Her family history includes Aneurysm in her maternal aunt; Anxiety disorder in her son; Cancer in her father and sister; Diabetes in her mother; Fibroids in her mother; Kidney disease in her maternal aunt. There is no history of Breast cancer, Ovarian cancer, or Colon cancer.      Allergies  Allergen Reactions  . Corn-Containing Products     Abdominal pain  . Shellfish Allergy     Abdominal pain     Current Outpatient Medications:   .  levonorgestrel (MIRENA) 20 MCG/24HR IUD, 1 each by Intrauterine route once., Disp: , Rfl:  .  sertraline (ZOLOFT) 50 MG tablet, Take 1 tablet (50 mg total) by mouth daily., Disp: 90 tablet, Rfl: 0   Patient Care Team: Paulene Floor as PCP - General (Physician Assistant) Benedetto Goad, RN as Case Manager [place], Cartwright M, LCSW as Social Worker    Objective:    Vitals: BP 114/79 (BP Location: Left Arm, Patient Position: Sitting, Cuff Size: Normal)   Pulse 79   Temp (!) 97.4 F (36.3 C) (Oral)   Ht 5\' 4"  (1.626 m)   Wt 167 lb 12.8 oz (76.1 kg)   BMI 28.80 kg/m    Vitals:   05/16/19 1459  BP: 114/79  Pulse: 79  Temp: (!) 97.4 F (36.3 C)  TempSrc: Oral  Weight: 167 lb 12.8 oz (76.1 kg)  Height: 5\' 4"  (1.626 m)     Physical Exam Constitutional:      Appearance: Normal appearance.  Cardiovascular:     Rate and Rhythm: Normal rate.  Pulmonary:     Effort: Pulmonary effort is normal.  Skin:    General: Skin is warm and dry.  Neurological:     Mental Status: She is alert and oriented to person, place, and time. Mental status is at baseline.  Psychiatric:        Mood and Affect: Mood normal.        Behavior: Behavior normal.      Depression Screen PHQ 2/9 Scores 04/27/2019 04/07/2019  PHQ - 2 Score 0 1  PHQ- 9 Score - 7       Assessment & Plan:     Routine Health Maintenance and Physical Exam  Exercise Activities and Dietary recommendations Goals   None      There is no immunization history on file for this patient.  Health Maintenance  Topic Date Due  . HIV Screening  01/19/1996  . TETANUS/TDAP  01/19/2000  . PAP SMEAR-Modifier  01/18/2002  . INFLUENZA VACCINE  06/03/2019     Discussed health benefits of physical activity, and encouraged her to engage in regular exercise appropriate for her age and condition.    1. Anxiety  Continue zoloft 50 mg daily.  2. Constipation, unspecified constipation type  Intolerant to Miralax.  Try metamucil. If not working, have sent home with samples of Linzess 72 mcg daily that she can try.   3. Need for Tdap vaccination  Declined.  4. Tobacco abuse counseling  The entirety of the information documented in the History of Present Illness,  Review of Systems and Physical Exam were personally obtained by me. Portions of this information were initially documented by Lynford Humphrey, CMA and reviewed by me for thoroughness and accuracy.   F/u 1 year   --------------------------------------------------------------------    Trinna Post, PA-C  [facility]

## 2019-05-17 ENCOUNTER — Other Ambulatory Visit: Payer: BLUE CROSS/BLUE SHIELD

## 2019-05-17 LAB — CYTOLOGY - PAP
HPV 16/18/45 genotyping: NEGATIVE
HPV: DETECTED — AB

## 2019-05-18 ENCOUNTER — Encounter: Payer: Self-pay | Admitting: Certified Nurse Midwife

## 2019-05-18 DIAGNOSIS — N643 Galactorrhea not associated with childbirth: Secondary | ICD-10-CM | POA: Insufficient documentation

## 2019-05-18 DIAGNOSIS — R7989 Other specified abnormal findings of blood chemistry: Secondary | ICD-10-CM | POA: Insufficient documentation

## 2019-05-18 DIAGNOSIS — R87821 Vaginal low risk human papillomavirus (HPV) DNA test positive: Secondary | ICD-10-CM | POA: Insufficient documentation

## 2019-05-18 DIAGNOSIS — R87612 Low grade squamous intraepithelial lesion on cytologic smear of cervix (LGSIL): Secondary | ICD-10-CM | POA: Insufficient documentation

## 2019-05-18 LAB — THYROID PANEL WITH TSH
Free Thyroxine Index: 1.7 (ref 1.2–4.9)
T3 Uptake Ratio: 21 % — ABNORMAL LOW (ref 24–39)
T4, Total: 8.3 ug/dL (ref 4.5–12.0)
TSH: 1.7 u[IU]/mL (ref 0.450–4.500)

## 2019-05-18 LAB — PROLACTIN: Prolactin: 90.9 ng/mL — ABNORMAL HIGH (ref 4.8–23.3)

## 2019-05-23 ENCOUNTER — Ambulatory Visit: Payer: Self-pay | Admitting: Surgery

## 2019-05-23 NOTE — Progress Notes (Signed)
Patient is scheduled for colpo on 9/2 with Melody. She wanted to schedule out a little ways. She also wanted the provider to be aware that she does not handle pain very well and needs to be put to sleep or she will pass out and throw up.

## 2019-05-26 ENCOUNTER — Ambulatory Visit: Payer: Self-pay | Admitting: Surgery

## 2019-06-06 ENCOUNTER — Encounter: Payer: Self-pay | Admitting: Surgery

## 2019-06-06 ENCOUNTER — Other Ambulatory Visit: Payer: Self-pay

## 2019-06-06 ENCOUNTER — Ambulatory Visit: Payer: BLUE CROSS/BLUE SHIELD | Admitting: Surgery

## 2019-06-06 DIAGNOSIS — L918 Other hypertrophic disorders of the skin: Secondary | ICD-10-CM | POA: Diagnosis not present

## 2019-06-06 DIAGNOSIS — L732 Hidradenitis suppurativa: Secondary | ICD-10-CM | POA: Diagnosis not present

## 2019-06-06 NOTE — Progress Notes (Signed)
06/06/2019  Reason for Visit:  Skin cysts and mass  Referring Provider:  Carles Collet, PA-C  History of Present Illness: Elizabeth Marks is a 38 y.o. female presenting for evaluation of cysts at her c-section scar.  She has a c-section 12 years ago.  She reports that since then, she has had issues with the scar.  Initially, her wound became infected and had MRSA infection and the wound had to be packed for dressing changes.  She has subsequently had intermittent periods of purulent drainage from cysts at the scar level.  She feels she has internal cysts in the area.  She also has a small pedunculated mass in the right lateral aspect of her lower abdominal wall.  This also came up after her c-section.  Denies any current cysts or drainage at this point.  She also reports that she has cysts as well in her bilateral upper inner thighs and axilla.  Also reports that the skin between her c-section scar and umbilicus has significant numbness that never improved after her c-section.  Past Medical History: Past Medical History:  Diagnosis Date  . Allergy   . Anxiety 04/07/2019     Past Surgical History: Past Surgical History:  Procedure Laterality Date  . CESAREAN SECTION    . TONSILLECTOMY AND ADENOIDECTOMY    . WISDOM TOOTH EXTRACTION      Home Medications: Prior to Admission medications   Medication Sig Start Date End Date Taking? Authorizing Provider  levonorgestrel (MIRENA) 20 MCG/24HR IUD 1 each by Intrauterine route once.    [provider]    Allergies: Allergies  Allergen Reactions  . Corn-Containing Products     Abdominal pain  . Shellfish Allergy     Abdominal pain  . Adhesive [Tape] Rash  . Latex Rash    Social History:  reports that she has been smoking. She has never used smokeless tobacco. She reports current alcohol use of about 1.0 standard drinks of alcohol per week. She reports current drug use. Drug: Other-see comments.   Family History: Family  History  Problem Relation Age of Onset  . Diabetes Mother   . Fibroids Mother   . Cancer Father   . Cancer Sister        uterus  . Anxiety disorder Son   . Kidney disease Maternal Aunt   . Aneurysm Maternal Aunt   . Breast cancer Neg Hx   . Ovarian cancer Neg Hx   . Colon cancer Neg Hx     Review of Systems: Review of Systems  Constitutional: Negative for chills and fever.  HENT: Negative for hearing loss.   Respiratory: Negative for shortness of breath.   Cardiovascular: Negative for chest pain.  Gastrointestinal: Negative for abdominal pain, nausea and vomiting.  Genitourinary: Negative for dysuria.  Musculoskeletal: Negative for myalgias.  Skin: Negative for rash.       Intermittent drainage from cysts  Neurological: Positive for sensory change.  Psychiatric/Behavioral: Negative for depression.    Physical Exam There were no vitals taken for this visit. CONSTITUTIONAL: No acute distress HEENT:  Normocephalic, atraumatic, extraocular motion intact. NECK: Trachea is midline, and there is no jugular venous distension.  RESPIRATORY:  Lungs are clear, and breath sounds are equal bilaterally. Normal respiratory effort without pathologic use of accessory muscles. CARDIOVASCULAR: Heart is regular without murmurs, gallops, or rubs. GI: The abdomen is soft, non-distended, non-tender to palpation.  The patient's c-section scar has healed well, with dimpling in the middle portion consistent with  open wound healed by secondary intention.  There is currently no cyst visible or palpable at the level of the scar itself and there is no drainage.  The skin above the scar does show numbness and does not hurt when pinching it. This extends about the width of the c-section scar up to the umbilicus. She also has a 3 cm pedunculated mass of the lateral right lower abdominal wall, consistent with a large skin tag.   MUSCULOSKELETAL:  Normal muscle strength and tone in all four extremities.  No  peripheral edema or cyanosis. SKIN: There are small cysts at the pubic tubercle consistent with hydradenitis as well as some cysts at bilateral upper inner thighs.  No active drainage or tenderness. NEUROLOGIC:  Motor and sensation is grossly normal.  Cranial nerves are grossly intact. PSYCH:  Alert and oriented to person, place and time. Affect is normal.  Laboratory Analysis: No results found for this or any previous visit (from the past 24 hour(s)).  Imaging: No results found.  Assessment and Plan: This is a 38 y.o. female presenting with complaints of cysts at the c-section scar, bilateral upper inner thighs, and axilla.  Discussed with the patient that based on her description as well as physical exam, I think she has hydradenitis.  The c-section scar is just at the hairline at the superior edge of the pubic tubercle, and is near two cysts that are more over the pubic tubercle itself.  The cysts at the thighs have the same appearance as hydradenitis.  I think that she's had flareups which cause the drainage.  At this point there is no active drainage, cellulitis, induration, or tenderness that would warrant I&D.  Discussed with the patient that I do not know the etiology of the numbness of the skin between her scar and umbilicus.  She does report having excess skin and fat that does not go away despite of dieting and exercise.   I discussed with the patient that for management of her hydradenitis, we would refer her to Dermatology.  For the excess skin of the lower abdomen, she would like a referral to Plastic Surgery.  I discussed with her that we could resect the large skin tag she has, or if she ends up having surgery with Plastic Surgery, they could also excise it.  She will call us back if she wants [place] to excise it instead.  Follow up prn.  Face-to-face time spent with the patient and care providers was 60 minutes, with more than 50% of the time spent counseling, educating, and  coordinating care of the patient.     Melvyn Neth, [facility]

## 2019-06-06 NOTE — Patient Instructions (Addendum)
The patient is aware to call back for any questions or new concerns. Referral to plastic surgery and dermatology  Hidradenitis Suppurativa Hidradenitis suppurativa is a long-term (chronic) skin disease. It is similar to a severe form of acne, but it affects areas of the body where acne would be unusual, especially areas of the body where skin rubs against skin and becomes moist. These include:  Underarms.  Groin.  Genital area.  Buttocks.  Upper thighs.  Breasts. Hidradenitis suppurativa may start out as small lumps or pimples caused by blocked sweat glands or hair follicles. Pimples may develop into deep sores that break open (rupture) and drain pus. Over time, affected areas of skin may thicken and become scarred. This condition is rare and does not spread from person to person (non-contagious). What are the causes? The exact cause of this condition is not known. It may be related to:  Female and female hormones.  An overactive disease-fighting system (immune system). The immune system may over-react to blocked hair follicles or sweat glands and cause swelling and pus-filled sores. What increases the risk? You are more likely to develop this condition if you:  Are female.  Are 55-3 years old.  Have a family history of hidradenitis suppurativa.  Have a personal history of acne.  Are overweight.  Smoke.  Take the medicine lithium. What are the signs or symptoms? The first symptoms are usually painful bumps in the skin, similar to pimples. The condition may get worse over time (progress), or it may only cause mild symptoms. If the disease progresses, symptoms may include:  Skin bumps getting bigger and growing deeper into the skin.  Bumps rupturing and draining pus.  Itchy, infected skin.  Skin getting thicker and scarred.  Tunnels under the skin (fistulas) where pus drains from a bump.  Pain during daily activities, such as pain during walking if your groin area is  affected.  Emotional problems, such as stress or depression. This condition may affect your appearance and your ability or willingness to wear certain clothes or do certain activities. How is this diagnosed? This condition is diagnosed by a health care provider who specializes in skin diseases (dermatologist). You may be diagnosed based on:  Your symptoms and medical history.  A physical exam.  Testing a pus sample for infection.  Blood tests. How is this treated? Your treatment will depend on how severe your symptoms are. The same treatment will not work for everybody with this condition. You may need to try several treatments to find what works best for you. Treatment may include:  Cleaning and bandaging (dressing) your wounds as needed.  Lifestyle changes, such as new skin care routines.  Taking medicines, such as: ? Antibiotics. ? Acne medicines. ? Medicines to reduce the activity of the immune system. ? A diabetes medicine (metformin). ? Birth control pills, for women. ? Steroids to reduce swelling and pain.  Working with a mental health care provider, if you experience emotional distress due to this condition. If you have severe symptoms that do not get better with medicine, you may need surgery. Surgery may involve:  Using a laser to clear the skin and remove hair follicles.  Opening and draining deep sores.  Removing the areas of skin that are diseased and scarred. Follow these instructions at home: Medicines   Take over-the-counter and prescription medicines only as told by your health care provider.  If you were prescribed an antibiotic medicine, take it as told by your health care provider.  Do not stop taking the antibiotic even if your condition improves. Skin care  If you have open wounds, cover them with a clean dressing as told by your health care provider. Keep wounds clean by washing them gently with soap and water when you bathe.  Do not shave the  areas where you get hidradenitis suppurativa.  Do not wear deodorant.  Wear loose-fitting clothes.  Try to avoid getting overheated or sweaty. If you get sweaty or wet, change into clean, dry clothes as soon as you can.  To help relieve pain and itchiness, cover sore areas with a warm, clean washcloth (warm compress) for 5-10 minutes as often as needed.  If told by your health care provider, take a bleach bath twice a week: ? Fill your bathtub halfway with water. ? Pour in  cup of unscented household bleach. ? Soak in the tub for 5-10 minutes. ? Only soak from the neck down. Avoid water on your face and hair. ? Shower to rinse off the bleach from your skin. General instructions  Learn as much as you can about your disease so that you have an active role in your treatment. Work closely with your health care provider to find treatments that work for you.  If you are overweight, work with your health care provider to lose weight as recommended.  Do not use any products that contain nicotine or tobacco, such as cigarettes and e-cigarettes. If you need help quitting, ask your health care provider.  If you struggle with living with this condition, talk with your health care provider or work with a mental health care provider as recommended.  Keep all follow-up visits as told by your health care provider. This is important. Where to find more information  Hidradenitis [place].: https://www.hs-foundation.org/ Contact a health care provider if you have:  A flare-up of hidradenitis suppurativa.  A fever or chills.  Trouble controlling your symptoms at home.  Trouble doing your daily activities because of your symptoms.  Trouble dealing with emotional problems related to your condition. Summary  Hidradenitis suppurativa is a long-term (chronic) skin disease. It is similar to a severe form of acne, but it affects areas of the body where acne would be unusual.   The first symptoms are usually painful bumps in the skin, similar to pimples. The condition may get worse over time (progress), or it may only cause mild symptoms.  If you have open wounds, cover them with a clean dressing as told by your health care provider. Keep wounds clean by washing them gently with soap and water when you bathe.  Besides skin care, treatment may include medicines, laser treatment, and surgery. This information is not intended to replace advice given to you by your health care provider. Make sure you discuss any questions you have with your health care provider. Document Released: 06/02/2004 Document Revised: 10/27/2017 Document Reviewed: 10/27/2017 Elsevier Patient Education  2020 [employer].

## 2019-06-08 ENCOUNTER — Encounter: Payer: Self-pay | Admitting: Physician Assistant

## 2019-06-16 ENCOUNTER — Ambulatory Visit: Payer: BLUE CROSS/BLUE SHIELD | Admitting: Physician Assistant

## 2019-07-05 ENCOUNTER — Encounter: Payer: BLUE CROSS/BLUE SHIELD | Admitting: Obstetrics and Gynecology

## 2019-07-11 ENCOUNTER — Other Ambulatory Visit: Payer: Self-pay | Admitting: Physician Assistant

## 2019-07-11 DIAGNOSIS — F419 Anxiety disorder, unspecified: Secondary | ICD-10-CM

## 2019-07-28 ENCOUNTER — Ambulatory Visit: Payer: BLUE CROSS/BLUE SHIELD | Admitting: Plastic Surgery

## 2019-07-28 ENCOUNTER — Encounter: Payer: Self-pay | Admitting: Plastic Surgery

## 2019-07-28 ENCOUNTER — Other Ambulatory Visit: Payer: Self-pay

## 2019-07-28 VITALS — BP 119/73 | HR 80 | Temp 97.1°F | Ht 64.0 in | Wt 163.6 lb

## 2019-07-28 DIAGNOSIS — N643 Galactorrhea not associated with childbirth: Secondary | ICD-10-CM | POA: Diagnosis not present

## 2019-07-28 DIAGNOSIS — R1907 Generalized intra-abdominal and pelvic swelling, mass and lump: Secondary | ICD-10-CM | POA: Diagnosis not present

## 2019-07-28 DIAGNOSIS — R109 Unspecified abdominal pain: Secondary | ICD-10-CM | POA: Insufficient documentation

## 2019-07-28 DIAGNOSIS — R1030 Lower abdominal pain, unspecified: Secondary | ICD-10-CM | POA: Diagnosis not present

## 2019-07-28 NOTE — Progress Notes (Signed)
Patient ID: Elizabeth Marks, female    DOB: [birth year], 38 y.o.   MRN: [id number]   Chief Complaint  Patient presents with  . Advice Only    for lipo  . Skin Problem    The patient is a 38 year old female here for evaluation of her abdomen.  The patient states that over the past couple of years she has had intermittent abdominal pain.  She had a C-section for her 38 year old.  She also has a 38 year old.  She also gets what she describes as milk from her ducts.  She feels like she gets cysts in her lower abdominal area where her C-section scar was intermittently they get worse and painful but they do not actually drain and then they sometimes clear up.  She is concerned about the pain and the ongoing changes.  She has a little fascial hernia on the lower right abdomen that may also be somewhat of a keloid.  It is hard to tell.  She is 5 feet 4 inches tall.  She weighs 163 pounds.  She is smoking but is working on quitting.  I do not feel a hernia in any other area..  I do not see any skin breakdown or wounds.  I do not feel any cysts or masses at this time.   Review of Systems  Constitutional: Negative for activity change and appetite change.  Eyes: Negative.   Respiratory: Negative.  Negative for chest tightness and shortness of breath.   Cardiovascular: Negative for leg swelling.  Gastrointestinal: Positive for abdominal pain. Negative for abdominal distention.  Endocrine: Negative.   Genitourinary: Negative.   Musculoskeletal: Negative.  Negative for back pain.  Neurological: Negative.   Hematological: Negative.   Psychiatric/Behavioral: Negative.     Past Medical History:  Diagnosis Date  . Allergy   . Anxiety 04/07/2019    Past Surgical History:  Procedure Laterality Date  . CESAREAN SECTION    . TONSILLECTOMY AND ADENOIDECTOMY    . WISDOM TOOTH EXTRACTION        Current Outpatient Medications:  .  levonorgestrel (MIRENA) 20 MCG/24HR IUD, 1 each by Intrauterine  route once., Disp: , Rfl:  .  sertraline (ZOLOFT) 50 MG tablet, Take 1 tablet by mouth daily., Disp: , Rfl:    Objective:   Vitals:   07/28/19 1528  BP: 119/73  Pulse: 80  Temp: (!) 97.1 F (36.2 C)  SpO2: 99%    Physical Exam Vitals signs and nursing note reviewed.  Constitutional:      Appearance: Normal appearance.  Cardiovascular:     Rate and Rhythm: Normal rate.     Pulses: Normal pulses.  Pulmonary:     Effort: Pulmonary effort is normal.  Abdominal:     General: Abdomen is flat. There is no distension.     Tenderness: There is abdominal tenderness. There is no guarding.    Neurological:     General: No focal deficit present.     Mental Status: She is alert and oriented to person, place, and time.  Psychiatric:        Mood and Affect: Mood normal.        Thought Content: Thought content normal.     Assessment & Plan:  Galactorrhea  Lower abdominal pain Plan on abdominal CT with contrast.  I have discussed all the above information with Dr. Ninfa Linden and he has agreed to see the patient. Pictures were obtained of the patient and placed in the chart with  the patient's or guardian's permission.  [place], DO

## 2019-08-01 ENCOUNTER — Ambulatory Visit (INDEPENDENT_AMBULATORY_CARE_PROVIDER_SITE_OTHER): Payer: BLUE CROSS/BLUE SHIELD | Admitting: Obstetrics and Gynecology

## 2019-08-01 ENCOUNTER — Encounter: Payer: Self-pay | Admitting: Obstetrics and Gynecology

## 2019-08-01 ENCOUNTER — Other Ambulatory Visit: Payer: Self-pay

## 2019-08-01 ENCOUNTER — Other Ambulatory Visit (HOSPITAL_COMMUNITY)
Admission: RE | Admit: 2019-08-01 | Discharge: 2019-08-01 | Disposition: A | Discharge status: Home / Self Care | Payer: BLUE CROSS/BLUE SHIELD | Source: Ambulatory Visit | Attending: Obstetrics and Gynecology | Admitting: Obstetrics and Gynecology

## 2019-08-01 VITALS — BP 98/71 | HR 93 | Ht 63.0 in | Wt 162.0 lb

## 2019-08-01 DIAGNOSIS — R87612 Low grade squamous intraepithelial lesion on cytologic smear of cervix (LGSIL): Secondary | ICD-10-CM | POA: Insufficient documentation

## 2019-08-01 NOTE — Progress Notes (Signed)
   Continue recommended antenatal testing and prenatal care.ENCOMPASS COLPOSCOPY PROCEDURE NOTE  38 y.o. [id number] here for colposcopy for low-grade squamous intraepithelial neoplasia (LGSIL - encompassing HPV,mild dysplasia,CIN I) pap smear on 05/2019. Discussed role for HPV in cervical dysplasia, need for surveillance.  Patient given informed consent, signed copy in the chart, time out was performed.  Placed in lithotomy position. Cervix viewed with speculum and colposcope after application of acetic acid.   Colposcopy adequate? Yes  visible lesion(s) at 6 o'clock; corresponding biopsies obtained.  ECC specimen obtained. All specimens were labelled and sent to pathology. See scanned colpo sheet with detailed drawing.   Patient was given post procedure instructions.  Will follow up pathology and manage accordingly.  Routine preventative health maintenance measures emphasized.     Elizabeth Marks, CNM

## 2019-08-01 NOTE — Patient Instructions (Signed)

## 2019-08-01 NOTE — Addendum Note (Signed)
Addended by: Keturah Barre L on: 08/01/2019 02:45 PM   Modules accepted: Orders

## 2019-08-03 ENCOUNTER — Telehealth: Payer: Self-pay | Admitting: Obstetrics and Gynecology

## 2019-08-03 ENCOUNTER — Other Ambulatory Visit: Payer: Self-pay | Admitting: Surgical

## 2019-08-03 LAB — SURGICAL PATHOLOGY

## 2019-08-03 NOTE — Telephone Encounter (Signed)
The patient called and stated that she needs another referral put in. The patient is requesting a call back if possible, Please advise.

## 2019-08-08 ENCOUNTER — Other Ambulatory Visit: Payer: Self-pay | Admitting: Certified Nurse Midwife

## 2019-08-08 ENCOUNTER — Encounter: Payer: Self-pay | Admitting: Certified Nurse Midwife

## 2019-08-08 ENCOUNTER — Telehealth: Payer: Self-pay | Admitting: Obstetrics and Gynecology

## 2019-08-08 DIAGNOSIS — L732 Hidradenitis suppurativa: Secondary | ICD-10-CM | POA: Insufficient documentation

## 2019-08-08 NOTE — Telephone Encounter (Signed)
Patient is wanting a referral for plastic surgery.She would like to see Dr. Ninfa Linden. Do you feel comfortable placing referral? You had placed the original referral for general surgery. She needs it placed by you so insurance may pay for it.

## 2019-08-08 NOTE — Telephone Encounter (Signed)
Referral placed per patient request. See Chart. Thanks, JML

## 2019-08-08 NOTE — Telephone Encounter (Signed)
Tried calling pt several times-na

## 2019-08-08 NOTE — Telephone Encounter (Signed)
Patient called and stated that she needs to speak with her nurse in regards to her needing a referral placed to schedule an apt at a pcp. Please advise.

## 2019-08-11 ENCOUNTER — Ambulatory Visit (INDEPENDENT_AMBULATORY_CARE_PROVIDER_SITE_OTHER): Payer: BLUE CROSS/BLUE SHIELD | Admitting: Obstetrics and Gynecology

## 2019-08-11 ENCOUNTER — Encounter: Payer: Self-pay | Admitting: Obstetrics and Gynecology

## 2019-08-11 ENCOUNTER — Other Ambulatory Visit: Payer: Self-pay

## 2019-08-11 VITALS — BP 106/71 | HR 65 | Ht 63.0 in | Wt 162.5 lb

## 2019-08-11 DIAGNOSIS — Z01818 Encounter for other preprocedural examination: Secondary | ICD-10-CM

## 2019-08-11 DIAGNOSIS — D069 Carcinoma in situ of cervix, unspecified: Secondary | ICD-10-CM

## 2019-08-11 NOTE — H&P (View-Only) (Signed)
HPI:      [name] is a 38 y.o. [phone] who LMP was No LMP recorded. (Menstrual status: IUD).  Subjective:   She presents today for preop appointment.  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 She is refusing LEEP in the office and specifically requesting LEEP in the operating room. She underwent colposcopy with Elizabeth Marks.    Hx: The following portions of the patient's history were reviewed and updated as appropriate:             She  has a past medical history of Allergy and Anxiety (04/07/2019). She does not have any pertinent problems on file. She  has a past surgical history that includes Cesarean section; Tonsillectomy and adenoidectomy; and Wisdom tooth extraction. Her family history includes Aneurysm in her maternal aunt; Anxiety disorder in her son; Cancer in her father and sister; Diabetes in her mother; Fibroids in her mother; Kidney disease in her maternal aunt. She  reports that she has been smoking. She has never used smokeless tobacco. She reports current alcohol use of about 1.0 standard drinks of alcohol per week. She reports current drug use. Drug: Other-see comments. She has a current medication list which includes the following prescription(s): levonorgestrel and sertraline. She is allergic to corn-containing products; shellfish allergy; adhesive [tape]; and latex.       Review of Systems:  Review of Systems  Constitutional: Denied constitutional symptoms, night sweats, recent illness, fatigue, fever, insomnia and weight loss.  Eyes: Denied eye symptoms, eye pain, photophobia, vision change and visual disturbance.  Ears/Nose/Throat/Neck: Denied ear, nose, throat or neck symptoms, hearing loss, nasal discharge, sinus congestion and sore throat.  Cardiovascular: Denied cardiovascular symptoms, arrhythmia, chest pain/pressure, edema, exercise intolerance, orthopnea and palpitations.  Respiratory: Denied pulmonary symptoms, asthma, pleuritic pain, productive  sputum, cough, dyspnea and wheezing.  Gastrointestinal: Denied, gastro-esophageal reflux, melena, nausea and vomiting.  Genitourinary: See HPI for additional information.  Musculoskeletal: Denied musculoskeletal symptoms, stiffness, swelling, muscle weakness and myalgia.  Dermatologic: Denied dermatology symptoms, rash and scar.  Neurologic: Denied neurology symptoms, dizziness, headache, neck pain and syncope.  Psychiatric: Denied psychiatric symptoms, anxiety and depression.  Endocrine: Denied endocrine symptoms including hot flashes and night sweats.   Meds:   Current Outpatient Medications on File Prior to Visit  Medication Sig Dispense Refill  . levonorgestrel (MIRENA) 20 MCG/24HR IUD 1 each by Intrauterine route once.    . sertraline (ZOLOFT) 50 MG tablet Take 1 tablet by mouth daily.     No current facility-administered medications on file prior to visit.     Objective:     Vitals:   08/11/19 0911  BP: 106/71  Pulse: 65              Physical examination General NAD, Conversant  HEENT Atraumatic; Op clear with mmm.  Normo-cephalic. Pupils reactive. Anicteric sclerae  Thyroid/Neck Smooth without nodularity or enlargement. Normal ROM.  Neck Supple.  Skin No rashes, lesions or ulceration. Normal palpated skin turgor. No nodularity.  Breasts: No masses or discharge.  Symmetric.  No axillary adenopathy.  Lungs: Clear to auscultation.No rales or wheezes. Normal Respiratory effort, no retractions.  Heart: NSR.  No murmurs or rubs appreciated. No periferal edema  Abdomen: Soft.  Non-tender.  No masses.  No HSM. No hernia  Extremities: Moves all appropriately.  Normal ROM for age. No lymphadenopathy.  Neuro: Oriented to PPT.  Normal mood. Normal affect.     Pelvic:  Deferred to OR at the time of surgery  Assessment:    [id number] Patient Active Problem List   Diagnosis Date Noted  . Hydradenitis 08/08/2019  . Abdominal pain 07/28/2019  . Elevated prolactin level  05/18/2019  . 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 Galactorrhea 05/18/2019  . Anxiety 04/07/2019  . Bloating 04/07/2019     1. Preop examination   2. High grade squamous intraepithelial lesion (HGSIL), grade 3 CIN, on biopsy of cervix        Plan:            1.  LEEP-in the OR plan Top-Hat type procedure  2.  Patient has Mirena IUD in place because of the involvement of the endocervical canal consideration of removal and replacement at the time of surgery.  Risks and benefits of surgery discussed in detail.  CIN and HPV discussed.  Treatment success rates for LEEP reviewed.  Follow-up after LEEP discussed in detail.  Effect of smoking on CIN discussed-patient advised to discontinue tobacco use.  Possible replacement of IUD discussed in detail.  Risks of blood loss, future fertility and anesthesia risks reviewed.  All questions were answered. Orders No orders of the defined types were placed in this encounter.   No orders of the defined types were placed in this encounter.     F/U  Return for As Scheduled Post-op. I spent 28 minutes involved in the care of this patient of which greater than 50% was spent discussing see above plan regarding discussion of LEEP, risks and benefits, CIN/cervical dysplasia, follow-up after LEEP.  Finis Bud, M.D. 08/11/2019 10:38 AM

## 2019-08-11 NOTE — Progress Notes (Signed)
HPI:      [name] is a 38 y.o. [phone] who LMP was No LMP recorded. (Menstrual status: IUD).  Subjective:   She presents today for preop appointment.  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 She is refusing LEEP in the office and specifically requesting LEEP in the operating room. She underwent colposcopy with Elizabeth Marks.    Hx: The following portions of the patient's history were reviewed and updated as appropriate:             She  has a past medical history of Allergy and Anxiety (04/07/2019). She does not have any pertinent problems on file. She  has a past surgical history that includes Cesarean section; Tonsillectomy and adenoidectomy; and Wisdom tooth extraction. Her family history includes Aneurysm in her maternal aunt; Anxiety disorder in her son; Cancer in her father and sister; Diabetes in her mother; Fibroids in her mother; Kidney disease in her maternal aunt. She  reports that she has been smoking. She has never used smokeless tobacco. She reports current alcohol use of about 1.0 standard drinks of alcohol per week. She reports current drug use. Drug: Other-see comments. She has a current medication list which includes the following prescription(s): levonorgestrel and sertraline. She is allergic to corn-containing products; shellfish allergy; adhesive [tape]; and latex.       Review of Systems:  Review of Systems  Constitutional: Denied constitutional symptoms, night sweats, recent illness, fatigue, fever, insomnia and weight loss.  Eyes: Denied eye symptoms, eye pain, photophobia, vision change and visual disturbance.  Ears/Nose/Throat/Neck: Denied ear, nose, throat or neck symptoms, hearing loss, nasal discharge, sinus congestion and sore throat.  Cardiovascular: Denied cardiovascular symptoms, arrhythmia, chest pain/pressure, edema, exercise intolerance, orthopnea and palpitations.  Respiratory: Denied pulmonary symptoms, asthma, pleuritic pain, productive  sputum, cough, dyspnea and wheezing.  Gastrointestinal: Denied, gastro-esophageal reflux, melena, nausea and vomiting.  Genitourinary: See HPI for additional information.  Musculoskeletal: Denied musculoskeletal symptoms, stiffness, swelling, muscle weakness and myalgia.  Dermatologic: Denied dermatology symptoms, rash and scar.  Neurologic: Denied neurology symptoms, dizziness, headache, neck pain and syncope.  Psychiatric: Denied psychiatric symptoms, anxiety and depression.  Endocrine: Denied endocrine symptoms including hot flashes and night sweats.   Meds:   Current Outpatient Medications on File Prior to Visit  Medication Sig Dispense Refill  . levonorgestrel (MIRENA) 20 MCG/24HR IUD 1 each by Intrauterine route once.    . sertraline (ZOLOFT) 50 MG tablet Take 1 tablet by mouth daily.     No current facility-administered medications on file prior to visit.     Objective:     Vitals:   08/11/19 0911  BP: 106/71  Pulse: 65              Physical examination General NAD, Conversant  HEENT Atraumatic; Op clear with mmm.  Normo-cephalic. Pupils reactive. Anicteric sclerae  Thyroid/Neck Smooth without nodularity or enlargement. Normal ROM.  Neck Supple.  Skin No rashes, lesions or ulceration. Normal palpated skin turgor. No nodularity.  Breasts: No masses or discharge.  Symmetric.  No axillary adenopathy.  Lungs: Clear to auscultation.No rales or wheezes. Normal Respiratory effort, no retractions.  Heart: NSR.  No murmurs or rubs appreciated. No periferal edema  Abdomen: Soft.  Non-tender.  No masses.  No HSM. No hernia  Extremities: Moves all appropriately.  Normal ROM for age. No lymphadenopathy.  Neuro: Oriented to PPT.  Normal mood. Normal affect.     Pelvic:  Deferred to OR at the time of surgery  Assessment:    [id number] Patient Active Problem List   Diagnosis Date Noted  . Hydradenitis 08/08/2019  . Abdominal pain 07/28/2019  . Elevated prolactin level  05/18/2019  . 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 Galactorrhea 05/18/2019  . Anxiety 04/07/2019  . Bloating 04/07/2019     1. Preop examination   2. High grade squamous intraepithelial lesion (HGSIL), grade 3 CIN, on biopsy of cervix        Plan:            1.  LEEP-in the OR plan Top-Hat type procedure  2.  Patient has Mirena IUD in place because of the involvement of the endocervical canal consideration of removal and replacement at the time of surgery.  Risks and benefits of surgery discussed in detail.  CIN and HPV discussed.  Treatment success rates for LEEP reviewed.  Follow-up after LEEP discussed in detail.  Effect of smoking on CIN discussed-patient advised to discontinue tobacco use.  Possible replacement of IUD discussed in detail.  Risks of blood loss, future fertility and anesthesia risks reviewed.  All questions were answered. Orders No orders of the defined types were placed in this encounter.   No orders of the defined types were placed in this encounter.     F/U  Return for As Scheduled Post-op. I spent 28 minutes involved in the care of this patient of which greater than 50% was spent discussing see above plan regarding discussion of LEEP, risks and benefits, CIN/cervical dysplasia, follow-up after LEEP.  Finis Bud, M.D. 08/11/2019 10:38 AM

## 2019-08-11 NOTE — H&P (Signed)
HPI:      [name] is a 38 y.o. [phone] who LMP was No LMP recorded. (Menstrual status: IUD).  Subjective:   She presents today for preop appointment.  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 She is refusing LEEP in the office and specifically requesting LEEP in the operating room. She underwent colposcopy with Melody Shambley.    Hx: The following portions of the patient's history were reviewed and updated as appropriate:             She  has a past medical history of Allergy and Anxiety (04/07/2019). She does not have any pertinent problems on file. She  has a past surgical history that includes Cesarean section; Tonsillectomy and adenoidectomy; and Wisdom tooth extraction. Her family history includes Aneurysm in her maternal aunt; Anxiety disorder in her son; Cancer in her father and sister; Diabetes in her mother; Fibroids in her mother; Kidney disease in her maternal aunt. She  reports that she has been smoking. She has never used smokeless tobacco. She reports current alcohol use of about 1.0 standard drinks of alcohol per week. She reports current drug use. Drug: Other-see comments. She has a current medication list which includes the following prescription(s): levonorgestrel and sertraline. She is allergic to corn-containing products; shellfish allergy; adhesive [tape]; and latex.       Review of Systems:  Review of Systems  Constitutional: Denied constitutional symptoms, night sweats, recent illness, fatigue, fever, insomnia and weight loss.  Eyes: Denied eye symptoms, eye pain, photophobia, vision change and visual disturbance.  Ears/Nose/Throat/Neck: Denied ear, nose, throat or neck symptoms, hearing loss, nasal discharge, sinus congestion and sore throat.  Cardiovascular: Denied cardiovascular symptoms, arrhythmia, chest pain/pressure, edema, exercise intolerance, orthopnea and palpitations.  Respiratory: Denied pulmonary symptoms, asthma, pleuritic pain,  productive sputum, cough, dyspnea and wheezing.  Gastrointestinal: Denied, gastro-esophageal reflux, melena, nausea and vomiting.  Genitourinary: See HPI for additional information.  Musculoskeletal: Denied musculoskeletal symptoms, stiffness, swelling, muscle weakness and myalgia.  Dermatologic: Denied dermatology symptoms, rash and scar.  Neurologic: Denied neurology symptoms, dizziness, headache, neck pain and syncope.  Psychiatric: Denied psychiatric symptoms, anxiety and depression.  Endocrine: Denied endocrine symptoms including hot flashes and night sweats.   Meds:   Current Outpatient Medications on File Prior to Visit  Medication Sig Dispense Refill  . levonorgestrel (MIRENA) 20 MCG/24HR IUD 1 each by Intrauterine route once.    . sertraline (ZOLOFT) 50 MG tablet Take 1 tablet by mouth daily.     No current facility-administered medications on file prior to visit.     Objective:     Vitals:   08/11/19 0911  BP: 106/71  Pulse: 65              Physical examination General NAD, Conversant  HEENT Atraumatic; Op clear with mmm.  Normo-cephalic. Pupils reactive. Anicteric sclerae  Thyroid/Neck Smooth without nodularity or enlargement. Normal ROM.  Neck Supple.  Skin No rashes, lesions or ulceration. Normal palpated skin turgor. No nodularity.  Breasts: No masses or discharge.  Symmetric.  No axillary adenopathy.  Lungs: Clear to auscultation.No rales or wheezes. Normal Respiratory effort, no retractions.  Heart: NSR.  No murmurs or rubs appreciated. No periferal edema  Abdomen: Soft.  Non-tender.  No masses.  No HSM. No hernia  Extremities: Moves all appropriately.  Normal ROM for age. No lymphadenopathy.  Neuro: Oriented to PPT.  Normal mood. Normal affect.     Pelvic:  Deferred to OR at the time  of surgery      Assessment:    [id number] Patient Active Problem List   Diagnosis Date Noted  . Hydradenitis 08/08/2019  . Abdominal pain 07/28/2019  . Elevated prolactin  level 05/18/2019  . 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 Galactorrhea 05/18/2019  . Anxiety 04/07/2019  . Bloating 04/07/2019     1. Preop examination   2. High grade squamous intraepithelial lesion (HGSIL), grade 3 CIN, on biopsy of cervix        Plan:            1.  LEEP-in the OR plan Top-Hat type procedure  2.  Patient has Mirena IUD in place because of the involvement of the endocervical canal consideration of removal and replacement at the time of surgery.  Risks and benefits of surgery discussed in detail.  CIN and HPV discussed.  Treatment success rates for LEEP reviewed.  Follow-up after LEEP discussed in detail.  Effect of smoking on CIN discussed-patient advised to discontinue tobacco use.  Possible replacement of IUD discussed in detail.  Risks of blood loss, future fertility and anesthesia risks reviewed.  All questions were answered. Orders No orders of the defined types were placed in this encounter.   No orders of the defined types were placed in this encounter.     F/U  Return for As Scheduled Post-op. I spent 28 minutes involved in the care of this patient of which greater than 50% was spent discussing see above plan regarding discussion of LEEP, risks and benefits, CIN/cervical dysplasia, follow-up after LEEP.

## 2019-08-11 NOTE — Progress Notes (Signed)
Patient comes in today for a consult.

## 2019-08-11 NOTE — Addendum Note (Signed)
Addended by: Finis Bud on: 08/11/2019 10:47 AM   Modules accepted: Orders, SmartSet

## 2019-08-21 ENCOUNTER — Other Ambulatory Visit: Payer: Self-pay

## 2019-08-21 ENCOUNTER — Other Ambulatory Visit
Admission: RE | Admit: 2019-08-21 | Discharge: 2019-08-21 | Disposition: A | Discharge status: Home / Self Care | Payer: BLUE CROSS/BLUE SHIELD | Source: Ambulatory Visit | Attending: Obstetrics and Gynecology | Admitting: Obstetrics and Gynecology

## 2019-08-21 NOTE — Patient Instructions (Signed)
Your procedure is scheduled on: Friday 08/25/19 Report to [facility]. To find out your arrival time please call [phone] between 1PM - 3PM on Thursday 08/24/19.  Remember: Instructions that are not followed completely may result in serious medical risk, up to and including death, or upon the discretion of your surgeon and anesthesiologist your surgery may need to be rescheduled.     _X__ 1. Do not eat food after midnight the night before your procedure.                 No gum chewing or hard candies. You may drink clear liquids up to 2 hours                 before you are scheduled to arrive for your surgery- DO not drink clear                 liquids within 2 hours of the start of your surgery.                 Clear Liquids include:  water, apple juice without pulp, clear carbohydrate                 drink such as Clearfast or Gatorade, Black Coffee or Tea (Do not add                 anything to coffee or tea). Diabetics water only DRINK ENSURE PRE-SURGERY CARBOHYDRATE DRINK 2 HOURS BEFORE ARRIVING   __X__2.  On the morning of surgery brush your teeth with toothpaste and water, you                 may rinse your mouth with mouthwash if you wish.  Do not swallow any              toothpaste of mouthwash.     _X__ 3.  No Alcohol for 24 hours before or after surgery.   _X__ 4.  Do Not Smoke or use e-cigarettes For 24 Hours Prior to Your Surgery.                 Do not use any chewable tobacco products for at least 6 hours prior to                 surgery.  ____  5.  Bring all medications with you on the day of surgery if instructed.   __X__  6.  Notify your doctor if there is any change in your medical condition      (cold, fever, infections).     Do not wear jewelry, make-up, hairpins, clips or nail polish. Do not wear lotions, powders, or perfumes.  Do not shave 48 hours prior to surgery. Men may shave face and neck. Do not  bring valuables to the hospital.    [facility] is not responsible for any belongings or valuables.  Contacts, dentures/partials or body piercings may not be worn into surgery. Bring a case for your contacts, glasses or hearing aids, a denture cup will be supplied. Leave your suitcase in the car. After surgery it may be brought to your room. For patients admitted to the hospital, discharge time is determined by your treatment team.   Patients discharged the day of surgery will not be allowed to drive home.   Please read over the following fact sheets that you were given:   MRSA Information  __X__ Take these  medicines the morning of surgery with A SIP OF WATER:    1. sertraline (ZOLOFT  2.   3.   4.  5.  6.  ____ Fleet Enema (as directed)   __ __ Use CHG Soap/SAGE wipes as directed  ____ Use inhalers on the day of surgery  ____ Stop metformin/Janumet/Farxiga 2 days prior to surgery    ____ Take 1/2 of usual insulin dose the night before surgery. No insulin the morning          of surgery.   ____ Stop Blood Thinners Coumadin/Plavix/Xarelto/Pleta/Pradaxa/Eliquis/Effient/Aspirin  on   Or contact your Surgeon, Cardiologist or Medical Doctor regarding  ability to stop your blood thinners  __X__ Stop Anti-inflammatories 7 days before surgery such as Advil, Ibuprofen, Motrin,  BC or Goodies Powder, Naprosyn, Naproxen, Aleve, Aspirin   You may take tylenol if needed  __X__ Stop all herbal supplements, fish oil or vitamin E until after surgery.    ____ Bring C-Pap to the hospital.

## 2019-08-22 ENCOUNTER — Other Ambulatory Visit: Admission: RE | Admit: 2019-08-22 | Payer: BLUE CROSS/BLUE SHIELD | Source: Ambulatory Visit

## 2019-08-23 ENCOUNTER — Other Ambulatory Visit
Admission: RE | Admit: 2019-08-23 | Discharge: 2019-08-23 | Disposition: A | Discharge status: Home / Self Care | Payer: BLUE CROSS/BLUE SHIELD | Source: Ambulatory Visit | Attending: Obstetrics and Gynecology | Admitting: Obstetrics and Gynecology

## 2019-08-23 ENCOUNTER — Other Ambulatory Visit: Payer: Self-pay

## 2019-08-23 DIAGNOSIS — Z20828 Contact with and (suspected) exposure to other viral communicable diseases: Secondary | ICD-10-CM | POA: Diagnosis not present

## 2019-08-23 DIAGNOSIS — Z01812 Encounter for preprocedural laboratory examination: Secondary | ICD-10-CM | POA: Insufficient documentation

## 2019-08-23 LAB — SARS CORONAVIRUS 2 (TAT 6-24 HRS): SARS Coronavirus 2: NEGATIVE

## 2019-08-25 ENCOUNTER — Ambulatory Visit
Admission: RE | Admit: 2019-08-25 | Discharge: 2019-08-25 | Disposition: A | Discharge status: Home / Self Care | Payer: BLUE CROSS/BLUE SHIELD | Attending: Obstetrics and Gynecology | Admitting: Obstetrics and Gynecology

## 2019-08-25 ENCOUNTER — Ambulatory Visit: Payer: BLUE CROSS/BLUE SHIELD | Admitting: Anesthesiology

## 2019-08-25 ENCOUNTER — Other Ambulatory Visit: Payer: Self-pay

## 2019-08-25 ENCOUNTER — Encounter
Admission: RE | Disposition: A | Discharge status: Home / Self Care | Payer: Self-pay | Source: Home / Self Care | Attending: Obstetrics and Gynecology

## 2019-08-25 DIAGNOSIS — Z30433 Encounter for removal and reinsertion of intrauterine contraceptive device: Secondary | ICD-10-CM

## 2019-08-25 DIAGNOSIS — Z91013 Allergy to seafood: Secondary | ICD-10-CM | POA: Diagnosis not present

## 2019-08-25 DIAGNOSIS — Z809 Family history of malignant neoplasm, unspecified: Secondary | ICD-10-CM | POA: Diagnosis not present

## 2019-08-25 DIAGNOSIS — D060 Carcinoma in situ of endocervix: Secondary | ICD-10-CM | POA: Insufficient documentation

## 2019-08-25 DIAGNOSIS — Z818 Family history of other mental and behavioral disorders: Secondary | ICD-10-CM | POA: Diagnosis not present

## 2019-08-25 DIAGNOSIS — Z841 Family history of disorders of kidney and ureter: Secondary | ICD-10-CM | POA: Diagnosis not present

## 2019-08-25 DIAGNOSIS — Z833 Family history of diabetes mellitus: Secondary | ICD-10-CM | POA: Insufficient documentation

## 2019-08-25 DIAGNOSIS — F17200 Nicotine dependence, unspecified, uncomplicated: Secondary | ICD-10-CM | POA: Diagnosis not present

## 2019-08-25 DIAGNOSIS — Z91048 Other nonmedicinal substance allergy status: Secondary | ICD-10-CM | POA: Diagnosis not present

## 2019-08-25 DIAGNOSIS — F419 Anxiety disorder, unspecified: Secondary | ICD-10-CM | POA: Diagnosis not present

## 2019-08-25 DIAGNOSIS — L732 Hidradenitis suppurativa: Secondary | ICD-10-CM | POA: Diagnosis not present

## 2019-08-25 DIAGNOSIS — Z8249 Family history of ischemic heart disease and other diseases of the circulatory system: Secondary | ICD-10-CM | POA: Insufficient documentation

## 2019-08-25 DIAGNOSIS — Z79899 Other long term (current) drug therapy: Secondary | ICD-10-CM | POA: Insufficient documentation

## 2019-08-25 DIAGNOSIS — Z793 Long term (current) use of hormonal contraceptives: Secondary | ICD-10-CM | POA: Insufficient documentation

## 2019-08-25 DIAGNOSIS — E221 Hyperprolactinemia: Secondary | ICD-10-CM | POA: Diagnosis not present

## 2019-08-25 DIAGNOSIS — R87613 High grade squamous intraepithelial lesion on cytologic smear of cervix (HGSIL): Secondary | ICD-10-CM | POA: Diagnosis present

## 2019-08-25 DIAGNOSIS — Z91040 Latex allergy status: Secondary | ICD-10-CM | POA: Diagnosis not present

## 2019-08-25 DIAGNOSIS — D069 Carcinoma in situ of cervix, unspecified: Secondary | ICD-10-CM

## 2019-08-25 HISTORY — PX: LEEP: SHX91

## 2019-08-25 HISTORY — PX: INTRAUTERINE DEVICE (IUD) INSERTION: SHX5877

## 2019-08-25 HISTORY — PX: IUD REMOVAL: SHX5392

## 2019-08-25 LAB — POCT PREGNANCY, URINE: Preg Test, Ur: NEGATIVE

## 2019-08-25 SURGERY — LEEP (LOOP ELECTROSURGICAL EXCISION PROCEDURE)
Anesthesia: General

## 2019-08-25 MED ORDER — FERRIC SUBSULFATE 259 MG/GM EX SOLN
CUTANEOUS | Status: DC | PRN
  Administered 2019-08-25: 1 via TOPICAL

## 2019-08-25 MED ORDER — ONDANSETRON HCL 4 MG/2ML IJ SOLN: 4.0000 mg | Freq: Once | INTRAMUSCULAR | Status: DC | PRN

## 2019-08-25 MED ORDER — FENTANYL CITRATE (PF) 100 MCG/2ML IJ SOLN
INTRAMUSCULAR | Status: DC | PRN
  Administered 2019-08-25 (×2): 25 ug via INTRAVENOUS

## 2019-08-25 MED ORDER — FENTANYL CITRATE (PF) 250 MCG/5ML IJ SOLN
INTRAMUSCULAR | Status: AC
  Filled 2019-08-25: qty 5

## 2019-08-25 MED ORDER — FERRIC SUBSULFATE 259 MG/GM EX SOLN
CUTANEOUS | Status: AC
  Filled 2019-08-25: qty 8

## 2019-08-25 MED ORDER — PROPOFOL 10 MG/ML IV BOLUS
INTRAVENOUS | Status: AC
  Filled 2019-08-25: qty 20

## 2019-08-25 MED ORDER — ONDANSETRON HCL 4 MG/2ML IJ SOLN: 4.0000 mg | Freq: Four times a day (QID) | INTRAMUSCULAR | Status: DC | PRN

## 2019-08-25 MED ORDER — FENTANYL CITRATE (PF) 100 MCG/2ML IJ SOLN
25.0000 ug | INTRAMUSCULAR | Status: DC | PRN
  Administered 2019-08-25: 14:00:00 50 ug via INTRAVENOUS

## 2019-08-25 MED ORDER — LIDOCAINE HCL (PF) 2 % IJ SOLN
INTRAMUSCULAR | Status: AC
  Filled 2019-08-25: qty 10

## 2019-08-25 MED ORDER — FENTANYL CITRATE (PF) 100 MCG/2ML IJ SOLN
INTRAMUSCULAR | Status: AC
  Administered 2019-08-25: 50 ug via INTRAVENOUS
  Filled 2019-08-25: qty 2

## 2019-08-25 MED ORDER — PROPOFOL 10 MG/ML IV BOLUS
INTRAVENOUS | Status: DC | PRN
  Administered 2019-08-25: 180 mg via INTRAVENOUS

## 2019-08-25 MED ORDER — IODINE STRONG (LUGOLS) 5 % PO SOLN
ORAL | Status: AC
  Filled 2019-08-25: qty 1

## 2019-08-25 MED ORDER — DEXAMETHASONE SODIUM PHOSPHATE 10 MG/ML IJ SOLN
INTRAMUSCULAR | Status: DC | PRN
  Administered 2019-08-25: 5 mg via INTRAVENOUS

## 2019-08-25 MED ORDER — ONDANSETRON 4 MG PO TBDP: 4.0000 mg | ORAL_TABLET | Freq: Four times a day (QID) | ORAL | Status: DC | PRN

## 2019-08-25 MED ORDER — LACTATED RINGERS IV SOLN
INTRAVENOUS | Status: DC
  Administered 2019-08-25: 12:00:00 via INTRAVENOUS

## 2019-08-25 MED ORDER — IODINE STRONG (LUGOLS) 5 % PO SOLN
ORAL | Status: DC | PRN
  Administered 2019-08-25: 0.2 mL via ORAL

## 2019-08-25 MED ORDER — ONDANSETRON HCL 4 MG/2ML IJ SOLN
INTRAMUSCULAR | Status: AC
  Filled 2019-08-25: qty 2

## 2019-08-25 MED ORDER — DEXTROSE IN LACTATED RINGERS 5 % IV SOLN: INTRAVENOUS | Status: DC

## 2019-08-25 MED ORDER — ONDANSETRON HCL 4 MG/2ML IJ SOLN
INTRAMUSCULAR | Status: DC | PRN
  Administered 2019-08-25: 4 mg via INTRAVENOUS

## 2019-08-25 MED ORDER — MIDAZOLAM HCL 2 MG/2ML IJ SOLN
INTRAMUSCULAR | Status: DC | PRN
  Administered 2019-08-25: 2 mg via INTRAVENOUS

## 2019-08-25 MED ORDER — MIDAZOLAM HCL 2 MG/2ML IJ SOLN
INTRAMUSCULAR | Status: AC
  Filled 2019-08-25: qty 2

## 2019-08-25 MED ORDER — DEXAMETHASONE SODIUM PHOSPHATE 10 MG/ML IJ SOLN
INTRAMUSCULAR | Status: AC
  Filled 2019-08-25: qty 1

## 2019-08-25 SURGICAL SUPPLY — 38 items
APPLICATOR COTTON TIP 6 STRL (MISCELLANEOUS) ×2 IMPLANT
APPLICATOR COTTON TIP 6IN STRL (MISCELLANEOUS) ×4
APPLICATOR SWAB PROCTO LG 16IN (MISCELLANEOUS) ×4 IMPLANT
CANISTER SUCT 1200ML W/VALVE (MISCELLANEOUS) ×4 IMPLANT
CATH ROBINSON RED A/P 16FR (CATHETERS) ×4 IMPLANT
DRAPE UNDER BUTTOCK W/FLU (DRAPES) ×4 IMPLANT
DRSG TELFA 3X8 NADH (GAUZE/BANDAGES/DRESSINGS) ×4 IMPLANT
ELECT LEEP BALL 5MM 12CM (MISCELLANEOUS) ×8
ELECT LEEP LOOP 1.0CM .7CM (MISCELLANEOUS)
ELECT LEEP LOOP 20X10 R2010 (MISCELLANEOUS) ×4
ELECT LOOP 1.0X1.0CM R1010 (MISCELLANEOUS)
ELECT REM PT RETURN 9FT ADLT (ELECTROSURGICAL) ×4
ELECTRODE LEEP BALL 5MM 12CM (MISCELLANEOUS) ×2 IMPLANT
ELECTRODE LEEP LOOP 1.0CM .7CM (MISCELLANEOUS) ×2 IMPLANT
ELECTRODE LEP LOOP 20X10 R2010 (MISCELLANEOUS) ×2 IMPLANT
ELECTRODE LOOP 1.0X1.0CM R1010 (MISCELLANEOUS) ×2 IMPLANT
ELECTRODE REM PT RTRN 9FT ADLT (ELECTROSURGICAL) ×2 IMPLANT
GLOVE BIOGEL PI ORTHO PRO 7.5 (GLOVE) ×2
GLOVE INDICATOR 7.0 STRL GRN (GLOVE) ×4 IMPLANT
GLOVE PI ORTHO PRO STRL 7.5 (GLOVE) ×2 IMPLANT
GOWN STRL REUS W/ TWL LRG LVL3 (GOWN DISPOSABLE) ×4 IMPLANT
GOWN STRL REUS W/TWL LRG LVL3 (GOWN DISPOSABLE) ×4
HANDLE YANKAUER SUCT BULB TIP (MISCELLANEOUS) ×4 IMPLANT
KIT TURNOVER CYSTO (KITS) ×4 IMPLANT
LABEL OR SOLS (LABEL) ×2 IMPLANT
Mirena IUD ×2 IMPLANT
NDL SPNL 22GX3.5 QUINCKE BK (NEEDLE) ×2 IMPLANT
NEEDLE SPNL 22GX3.5 QUINCKE BK (NEEDLE) ×4 IMPLANT
PACK DNC HYST (MISCELLANEOUS) ×4 IMPLANT
PAD DRESSING TELFA 3X8 NADH (GAUZE/BANDAGES/DRESSINGS) ×2 IMPLANT
PAD PREP 24X41 OB/GYN DISP (PERSONAL CARE ITEMS) ×4 IMPLANT
PENCIL ELECTRO HAND CTR (MISCELLANEOUS) ×4 IMPLANT
SOL PREP PVP 2OZ (MISCELLANEOUS) ×4
SOLUTION PREP PVP 2OZ (MISCELLANEOUS) ×2 IMPLANT
STRAW SMOKE EVAC LEEP 6150 NON (MISCELLANEOUS) ×4 IMPLANT
SYR 10ML LL (SYRINGE) ×4 IMPLANT
TUBING CONNECTING 10 (TUBING) ×3 IMPLANT
TUBING CONNECTING 10' (TUBING) ×1

## 2019-08-25 NOTE — Interval H&P Note (Signed)
History and Physical Interval Note:  08/25/2019 12:52 PM  Elizabeth Marks  has presented today for surgery, with the diagnosis of HGSIL(HIGH GRADE SQUAMOUS INTRAEPITHELIAL LESION GRADE 3 CIN ON BIOPSY OF CERVIX).  The various methods of treatment have been discussed with the patient and family. After consideration of risks, benefits and other options for treatment, the patient has consented to  Procedure(s): LOOP ELECTROSURGICAL EXCISION PROCEDURE (LEEP) (N/A) as a surgical intervention.  The patient's history has been reviewed, patient examined, no change in status, stable for surgery.  I have reviewed the patient's chart and labs.  Questions were answered to the patient's satisfaction.     Jeannie Fend

## 2019-08-25 NOTE — Op Note (Signed)
    OPERATIVE NOTE 08/25/2019 1:59 PM  PRE-OPERATIVE DIAGNOSIS:  1) HGSIL(HIGH GRADE SQUAMOUS INTRAEPITHELIAL LESION GRADE 3 CIN ON BIOPSY OF CERVIX)  POST-OPERATIVE DIAGNOSIS:  * No Diagnosis Codes entered *  OPERATION:  LEEP  IUD removal and reinsertion  SURGEON(S): Surgeon(s) and Role:    Harlin Heys, MD - Primary   ANESTHESIA: General  ESTIMATED BLOOD LOSS: 2 mL  OPERATIVE FINDINGS:   SPECIMEN:  ID Type Source Tests Collected by Time Destination  1 :  psterior cervix GYN Cervix SURGICAL PATHOLOGY Harlin Heys, MD 08/25/2019 1333   2 : anterior cervix GYN Cervix SURGICAL PATHOLOGY Harlin Heys, MD 08/25/2019 1338   3 :  [place] cervix GYN Cervix SURGICAL PATHOLOGY Harlin Heys, MD [id number]     COMPLICATIONS: None  DRAINS: Foley to gravity  DISPOSITION: Stable to recovery room  DESCRIPTION OF PROCEDURE:      The patient was prepped and draped in the dorsal lithotomy position and placed under general anesthesia. IUD Removal Strings of IUD identified and grasped.  IUD removed without problem.  IUD noted to be intact.  Lugol's solution was used to identify any abnormal areas of the cervix. [place] current was used to remove the specimen.  It was labeled accordingly. The base and edges of the defect were then cauterized using coagulation current.  IUD Procedure  After sounding the uterus and noting the position, the IUD was placed in the usual manner without problem.  The string was cut to the appropriate length.    Monsel's solution was then applied in the usual manner.   Finis Bud, M.D. 08/25/2019 1:59 PM

## 2019-08-25 NOTE — Discharge Instructions (Addendum)
AMBULATORY SURGERY  DISCHARGE INSTRUCTIONS   1) The drugs that you were given will stay in your system until tomorrow so for the next 24 hours you should not:  A) Drive an automobile B) Make any legal decisions C) Drink any alcoholic beverage   2) You may resume regular meals tomorrow.  Today it is better to start with liquids and gradually work up to solid foods.  You may eat anything you prefer, but it is better to start with liquids, then soup and crackers, and gradually work up to solid foods.   3) Please notify your doctor immediately if you have any unusual bleeding, trouble breathing, redness and pain at the surgery site, drainage, fever, or pain not relieved by medication.    4) Additional Instructions:        Please contact your physician with any problems or Same Day Surgery at [phone], Monday through Friday 6 am to 4 pm, or [place] [facility] number at [phone].    Loop Electrosurgical Excision Procedure Loop electrosurgical excision procedure (LEEP) is the cutting and removal (excision) of tissue from the cervix. The cervix is the bottom part of the uterus that opens into the vagina. The tissue that is removed from the cervix is examined to see if there are precancerous cells or cancer cells present. LEEP may be done when:  You have abnormal bleeding from your cervix.  You have an abnormal Pap test result.  Your health care provider finds an abnormality on your cervix during a pelvic exam. LEEP typically only takes a few minutes and is often done in the health care provider's office. The procedure is safe for women who are trying to get pregnant. However, the procedure is usually not done during a menstrual period or during pregnancy. Tell a health care provider about:  Any allergies you have.  All medicines you are taking, including vitamins, herbs, eye drops, creams, and over-the-counter medicines.  Any blood disorders you have.  Any  medical conditions you have, including current or past vaginal infections such as herpes or sexually-transmitted infections (STIs).  Whether you are pregnant or may be pregnant.  Whether or not you are having vaginal bleeding on the day of the procedure. What are the risks? Generally, this is a safe procedure. However, problems may occur, including:  Infection.  Bleeding.  Allergic reactions to medicines.  Changes or scarring in the cervix.  Increased risk of early (preterm) labor in future pregnancies. What happens before the procedure?  Ask your health care provider about: ? Changing or stopping your regular medicines. This is especially important if you are taking diabetes medicines or blood thinners. ? Taking medicines such as aspirin and ibuprofen. These medicines can thin your blood. Do not take these medicines unless your health care provider tells you to take them. ? Taking over-the-counter medicines, vitamins, herbs, and supplements.  Your health care provider may recommend that you take pain medicine before the procedure.  Ask your health care provider if you should plan to have someone take you home after the procedure. What happens during the procedure?   An instrument called a speculum will be placed in your vagina. This will allow your health care provider to see your cervix.  You will be given a medicine to numb the area (local anesthetic). The medicine will be injected into your cervix and the surrounding area.  A solution will be applied to your cervix. This solution will help the health care provider find the  abnormal cells that need to be removed.  A thin wire loop will be passed through your vagina. The wire will be used to burn (cauterize) the cervical tissue with an electrical current.  You may feel faint during the procedure. Tell your health care provider right away if you feel this way.  The abnormal cervical tissue will be removed.  Any open blood  vessels will be cauterized to prevent bleeding.  A paste may be applied to the cauterized area of your cervix to help prevent bleeding.  The sample of cervical tissue will be examined under a microscope. The procedure may vary among health care providers and hospitals. What can I expect after the procedure? After the procedure, it is common to have:  Mild abdominal cramps that are similar to menstrual cramps. These may last for up to 1 week.  A small amount of pink-tinged or bloody vaginal discharge, including light to moderate bleeding, for 1-2 weeks.  A dark-colored discharge coming from your vagina. This is from the paste that was used on the cervix to prevent bleeding. It is up to you to get the results of your procedure. Ask your health care provider, or the department that is doing the procedure, when your results will be ready. Follow these instructions at home:  Take over-the-counter and prescription medicines only as told by your health care provider.  Return to your normal activities as told by your health care provider. Ask your health care provider what activities are safe for you.  Do not put anything in your vagina for 2 weeks after the procedure or until your health care provider says that it is okay. This includes tampons, creams, and douches.  Do not have sex until your health care provider approves.  Keep all follow-up visits as told by your health care provider. This is important. Contact a health care provider if you:  Have a fever or chills.  Feel unusually weak.  Have vaginal bleeding that is heavier or longer than a normal menstrual cycle. A sign of this can be soaking a pad with blood or bleeding with clots.  Develop a bad smelling vaginal discharge.  Have severe abdominal pain or cramping. Summary  Loop electrosurgical excision procedure (LEEP) is the removal of tissue from the cervix. The removed tissue will be checked for precancerous cells or cancer  cells.  LEEP typically only takes a few minutes and is often done in the health care provider's office.  Do not put anything in your vagina for 2 weeks after the procedure or until your health care provider says that it is okay. This includes tampons, creams, and douches.  Keep all follow-up visits as told by your health care provider. Ask your health care provider, or the department that is doing the procedure, when your results will be ready. This information is not intended to replace advice given to you by your health care provider. Make sure you discuss any questions you have with your health care provider. Document Released: 01/09/2003 Document Revised: 11/11/2018 Document Reviewed: 11/11/2018 Elsevier Patient Education  2020 [employer].

## 2019-08-25 NOTE — Anesthesia Postprocedure Evaluation (Signed)
Anesthesia Post Note  Patient: Elizabeth Marks  Procedure(s) Performed: LOOP ELECTROSURGICAL EXCISION PROCEDURE (LEEP) (N/A ) INTRAUTERINE DEVICE (IUD) REMOVAL INTRAUTERINE DEVICE (IUD) INSERTION  Patient location during evaluation: PACU Anesthesia Type: General Level of consciousness: awake and alert and oriented Pain management: pain level controlled Vital Signs Assessment: post-procedure vital signs reviewed and stable Respiratory status: spontaneous breathing, nonlabored ventilation and respiratory function stable Cardiovascular status: blood pressure returned to baseline and stable Postop Assessment: no signs of nausea or vomiting Anesthetic complications: no     Last Vitals:  Vitals:   08/25/19 1415 08/25/19 1430  BP: 103/66 (!) 120/57  Pulse: (!) 50 (!) 50  Resp: 10 13  Temp:    SpO2: 95% 96%    Last Pain:  Vitals:   08/25/19 1430  TempSrc:   PainSc: 2                  Dao Memmott

## 2019-08-25 NOTE — Transfer of Care (Signed)
Immediate Anesthesia Transfer of Care Note  Patient: Elizabeth Marks  Procedure(s) Performed: LOOP ELECTROSURGICAL EXCISION PROCEDURE (LEEP) (N/A ) INTRAUTERINE DEVICE (IUD) REMOVAL INTRAUTERINE DEVICE (IUD) INSERTION  Patient Location: PACU  Anesthesia Type:General  Level of Consciousness: awake and alert   Airway & Oxygen Therapy: Patient Spontanous Breathing and Patient connected to face mask oxygen  Post-op Assessment: Report given to RN and Post -op Vital signs reviewed and stable  Post vital signs: Reviewed and stable  Last Vitals:  Vitals Value Taken Time  BP 114/67 08/25/19 1344  Temp    Pulse 56 08/25/19 1346  Resp 15 08/25/19 1346  SpO2 100 % 08/25/19 1346  Vitals shown include unvalidated device data.  Last Pain:  Vitals:   08/25/19 1124  TempSrc: Tympanic  PainSc: 0-No pain         Complications: No apparent anesthesia complications

## 2019-08-25 NOTE — Anesthesia Post-op Follow-up Note (Signed)
Anesthesia QCDR form completed.        

## 2019-08-25 NOTE — Anesthesia Preprocedure Evaluation (Signed)
Anesthesia Evaluation  Patient identified by MRN, date of birth, ID band Patient awake    Reviewed: Allergy & Precautions, NPO status , Patient's Chart, lab work & pertinent test results  History of Anesthesia Complications Negative for: history of anesthetic complications  Airway Mallampati: II  TM Distance: >3 FB Neck ROM: Full    Dental no notable dental hx.    Pulmonary neg sleep apnea, neg COPD, Current Smoker and Patient abstained from smoking.,    breath sounds clear to auscultation- rhonchi (-) wheezing      Cardiovascular Exercise Tolerance: Good (-) hypertension(-) CAD, (-) Past MI, (-) Cardiac Stents and (-) CABG  Rhythm:Regular Rate:Normal - Systolic murmurs and - Diastolic murmurs    Neuro/Psych neg Seizures Anxiety negative neurological ROS     GI/Hepatic negative GI ROS, Neg liver ROS,   Endo/Other  negative endocrine ROSneg diabetes  Renal/GU negative Renal ROS     Musculoskeletal negative musculoskeletal ROS (+)   Abdominal (+) - obese,   Peds  Hematology negative hematology ROS (+)   Anesthesia Other Findings Past Medical History: No date: Allergy 04/07/2019: Anxiety   Reproductive/Obstetrics                             Anesthesia Physical Anesthesia Plan  ASA: II  Anesthesia Plan: General   Post-op Pain Management:    Induction: Intravenous  PONV Risk Score and Plan: 1 and Ondansetron, Dexamethasone and Midazolam  Airway Management Planned: LMA  Additional Equipment:   Intra-op Plan:   Post-operative Plan:   Informed Consent: I have reviewed the patients History and Physical, chart, labs and discussed the procedure including the risks, benefits and alternatives for the proposed anesthesia with the patient or authorized representative who has indicated his/her understanding and acceptance.     Dental advisory given  Plan Discussed with: CRNA and  Anesthesiologist  Anesthesia Plan Comments:         Anesthesia Quick Evaluation

## 2019-08-25 NOTE — Anesthesia Procedure Notes (Signed)
Procedure Name: Intubation Performed by: Fredderick Phenix, CRNA Pre-anesthesia Checklist: Patient identified, Emergency Drugs available, Suction available and Patient being monitored Patient Re-evaluated:Patient Re-evaluated prior to induction Oxygen Delivery Method: Circle system utilized Preoxygenation: Pre-oxygenation with 100% oxygen Induction Type: IV induction Ventilation: Mask ventilation without difficulty LMA: LMA inserted LMA Size: 4.0 Tube type: Oral Number of attempts: 1 Airway Equipment and Method: Oral airway Placement Confirmation: ETT inserted through vocal cords under direct vision,  positive ETCO2 and breath sounds checked- equal and bilateral Dental Injury: Teeth and Oropharynx as per pre-operative assessment

## 2019-08-28 ENCOUNTER — Encounter: Payer: Self-pay | Admitting: Obstetrics and Gynecology

## 2019-08-29 LAB — SURGICAL PATHOLOGY

## 2019-09-01 ENCOUNTER — Telehealth: Payer: Self-pay | Admitting: Obstetrics and Gynecology

## 2019-09-01 NOTE — Telephone Encounter (Signed)
The patient called and stated that she would like a call back from Dr. Amalia Hailey nurse. Patient did not disclose the topic of discussion/concern. Please advise.

## 2019-09-01 NOTE — Telephone Encounter (Signed)
Spoke with patient and she is having some dark discharge. I let her know that this is normal after a procedure. She is also having some vaginal itching. She will try monistat cream for the itching. I have moved her post op appointment to Tuesday so we can do a swab if she is still having the problem.

## 2019-09-05 ENCOUNTER — Encounter: Payer: Self-pay | Admitting: Obstetrics and Gynecology

## 2019-09-05 ENCOUNTER — Ambulatory Visit (INDEPENDENT_AMBULATORY_CARE_PROVIDER_SITE_OTHER): Payer: BLUE CROSS/BLUE SHIELD | Admitting: Obstetrics and Gynecology

## 2019-09-05 ENCOUNTER — Other Ambulatory Visit: Payer: Self-pay

## 2019-09-05 VITALS — BP 109/70 | HR 62 | Ht 62.5 in | Wt 163.9 lb

## 2019-09-05 DIAGNOSIS — D060 Carcinoma in situ of endocervix: Secondary | ICD-10-CM

## 2019-09-05 NOTE — Progress Notes (Signed)
HPI:      [name] is a 38 y.o. [phone] who LMP was No LMP recorded. (Menstrual status: IUD).  Subjective:   She presents today for follow-up after LEEP and IUD placement.  Patient is doing well.  She says she still has daily very light bleeding but otherwise no complaints. She is actively working on smoking cessation and is down to 5 cigarettes/day. (Discussion of use of blow pops/to 2 pops etc.  led to discussion of growing up and childhood candy stores.)    Hx: The following portions of the patient's history were reviewed and updated as appropriate:             She  has a past medical history of Allergy and Anxiety (04/07/2019). She does not have any pertinent problems on file. She  has a past surgical history that includes Cesarean section; Tonsillectomy and adenoidectomy; Wisdom tooth extraction; LEEP (N/A, 08/25/2019); IUD removal (08/25/2019); and Intrauterine device (iud) insertion (08/25/2019). Her family history includes Aneurysm in her maternal aunt; Anxiety disorder in her son; Cancer in her father and sister; Diabetes in her mother; Fibroids in her mother; Kidney disease in her maternal aunt. She  reports that she has been smoking cigarettes. She has been smoking about 0.25 packs per day. She has never used smokeless tobacco. She reports current alcohol use of about 1.0 standard drinks of alcohol per week. She reports current drug use. Drugs: Other-see comments and Marijuana. She has a current medication list which includes the following prescription(s): clindamycin, levonorgestrel, and sertraline. She is allergic to corn-containing products; shellfish allergy; adhesive [tape]; and latex.       Review of Systems:  Review of Systems  Constitutional: Denied constitutional symptoms, night sweats, recent illness, fatigue, fever, insomnia and weight loss.  Eyes: Denied eye symptoms, eye pain, photophobia, vision change and visual disturbance.  Ears/Nose/Throat/Neck: Denied  ear, nose, throat or neck symptoms, hearing loss, nasal discharge, sinus congestion and sore throat.  Cardiovascular: Denied cardiovascular symptoms, arrhythmia, chest pain/pressure, edema, exercise intolerance, orthopnea and palpitations.  Respiratory: Denied pulmonary symptoms, asthma, pleuritic pain, productive sputum, cough, dyspnea and wheezing.  Gastrointestinal: Denied, gastro-esophageal reflux, melena, nausea and vomiting.  Genitourinary: Denied genitourinary symptoms including symptomatic vaginal discharge, pelvic relaxation issues, and urinary complaints.  Musculoskeletal: Denied musculoskeletal symptoms, stiffness, swelling, muscle weakness and myalgia.  Dermatologic: Denied dermatology symptoms, rash and scar.  Neurologic: Denied neurology symptoms, dizziness, headache, neck pain and syncope.  Psychiatric: Denied psychiatric symptoms, anxiety and depression.  Endocrine: Denied endocrine symptoms including hot flashes and night sweats.   Meds:   Current Outpatient Medications on File Prior to Visit  Medication Sig Dispense Refill  . clindamycin (CLEOCIN T) 1 % lotion Apply 1 application topically daily.    Marland Kitchen levonorgestrel (MIRENA) 20 MCG/24HR IUD 1 each by Intrauterine route once.    . sertraline (ZOLOFT) 50 MG tablet Take 50 mg by mouth daily.      No current facility-administered medications on file prior to visit.     Objective:     Vitals:   09/05/19 0845  BP: 109/70  Pulse: 62              Pathology report discussed in detail  Assessment:    G2P2002 Patient Active Problem List   Diagnosis Date Noted  . Hydradenitis 08/08/2019  . Abdominal pain 07/28/2019  . 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 Vaginal low risk HPV DNA test positive  05/18/2019  . Galactorrhea 05/18/2019  . Anxiety 04/07/2019  . 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  First  Pap smear in 4 months.  2.  Continuation of work toward cessation of tobacco. Orders No orders of the defined types were placed in this encounter.   No orders of the defined types were placed in this encounter.     F/U  Return in about 4 months (around 01/03/2020).  Finis Bud, M.D. 09/05/2019 9:10 AM

## 2019-09-08 ENCOUNTER — Encounter: Payer: BLUE CROSS/BLUE SHIELD | Admitting: Obstetrics and Gynecology

## 2019-11-14 ENCOUNTER — Other Ambulatory Visit: Payer: Self-pay | Admitting: Physician Assistant

## 2019-11-14 NOTE — Telephone Encounter (Signed)
Requested medication (s) are due for refill today: no  Requested medication (s) are on the active medication list: yes  Last refill:  04/20/2019  Future visit scheduled: no  Notes to clinic:  no valid encounter within last 6 months Last filled by historical provider    Requested Prescriptions  Pending Prescriptions Disp Refills   sertraline (ZOLOFT) 50 MG tablet [Pharmacy Med Name: SERTRALINE HCL 50 MG TABLET] 90 tablet     Sig: TAKE 1 TABLET BY [place]      Psychiatry:  Antidepressants - SSRI Failed - 11/14/2019  8:11 AM      Failed - Valid encounter within last 6 months    Recent Outpatient Visits           6 months ago [place], [place], [place]   7 months ago [place], Wendee Beavers, [place]       Future Appointments             In 1 month Amalia Hailey, Nyoka Lint, MD [facility]

## 2019-11-14 NOTE — Progress Notes (Signed)
Patient: Elizabeth Marks Female    DOB: 10-Nov-1980   39 y.o.   MRN: [id number] Visit Date: 11/15/2019  Today's Provider: Trinna Post, PA-C   Chief Complaint  Patient presents with  . Anxiety   Subjective:    I, Elizabeth Marks,CMA am acting as a Education administrator for [employer].  Virtual Visit via Telephone Note  I connected with Elizabeth Marks on 11/15/19 at  3:40 PM EST by telephone verified that I am speaking with the correct person using two identifiers.  Location: Patient: Home Provider: Office   I discussed the limitations of evaluation and management by telemedicine and the availability of in person appointments. The patient expressed understanding and agreed to proceed.  HPI  Anxiety Patient presents today via MyChart visit for anxiety follow-up. Patient was last seen on 05/16/2019 and no changes was made. Patient is currently taking Zoloft 50 MG daily and reports good compliance with treatment. Patient states that her anxiety has been worse due to father been in hospital and starting a new job. Patient reports she has been taking zoloft 50 mg twice daily.   GAD 7 : Generalized Anxiety Score 11/15/2019  Nervous, Anxious, on Edge 1  Control/stop worrying 2  Worry too much - different things 2  Trouble relaxing 1  Restless 0  Easily annoyed or irritable 1  Afraid - awful might happen 1  Total GAD 7 Score 8  Anxiety Difficulty Somewhat difficult       Allergies  Allergen Reactions  . Corn-Containing Products     Abdominal pain  . Shellfish Allergy     Abdominal pain  . Adhesive [Tape] Rash  . Latex Rash     Current Outpatient Medications:  .  clindamycin (CLEOCIN T) 1 % lotion, Apply 1 application topically daily., Disp: , Rfl:  .  levonorgestrel (MIRENA) 20 MCG/24HR IUD, 1 each by Intrauterine route once., Disp: , Rfl:  .  sertraline (ZOLOFT) 50 MG tablet, Take 50 mg by mouth daily. , Disp: , Rfl:   Review of Systems  Constitutional:  Negative.   Respiratory: Negative.   Cardiovascular: Negative.   Neurological: Negative.   Psychiatric/Behavioral: The patient is nervous/anxious.     Social History   Tobacco Use  . Smoking status: Current Every Day Smoker    Packs/day: 0.25    Types: Cigarettes  . Smokeless tobacco: Never Used  Substance Use Topics  . Alcohol use: Yes    Alcohol/week: 1.0 standard drinks    Types: 1 Standard drinks or equivalent per week      Objective:   There were no vitals taken for this visit. There were no vitals filed for this visit.There is no height or weight on file to calculate BMI.   Physical Exam   No results found for any visits on 11/15/19.     Assessment & Plan     1. Anxiety  We will increase zoloft to 100 mg daily.   - sertraline (ZOLOFT) 100 MG tablet; Take 1 tablet (100 mg total) by mouth daily.  Dispense: 90 tablet; Refill: 1 - Ambulatory referral to Chronic Care Management Services  I discussed the assessment and treatment plan with the patient. The patient was provided an opportunity to ask questions and all were answered. The patient agreed with the plan and demonstrated an understanding of the instructions.   The patient was advised to call back or seek an in-person evaluation if the symptoms worsen or if the  condition fails to improve as anticipated.  The entirety of the information documented in the History of Present Illness, Review of Systems and Physical Exam were personally obtained by me. Portions of this information were initially documented by [employer] and reviewed by me for thoroughness and accuracy.      Trinna Post, PA-C  [facility]

## 2019-11-15 ENCOUNTER — Telehealth (INDEPENDENT_AMBULATORY_CARE_PROVIDER_SITE_OTHER): Payer: BLUE CROSS/BLUE SHIELD | Admitting: Physician Assistant

## 2019-11-15 DIAGNOSIS — F419 Anxiety disorder, unspecified: Secondary | ICD-10-CM

## 2019-11-15 MED ORDER — SERTRALINE HCL 100 MG PO TABS: 100.0000 mg | ORAL_TABLET | Freq: Every day | ORAL | 1 refills | Status: DC

## 2019-11-23 ENCOUNTER — Ambulatory Visit: Payer: Self-pay | Admitting: *Deleted

## 2019-11-23 NOTE — Chronic Care Management (AMB) (Signed)
  Chronic Care Management   Social Work Note  11/23/2019 Name: Elizabeth Marks MRN: [id number] DOB: 1981/08/22  Elizabeth Marks is a 39 y.o. year old female who sees Elizabeth Marks, [place] for primary care. The CCM team was consulted for assistance with Mental Health Counseling and Resources.   Phone call to patient to follow up on mental health needs. Patient was at work at the time of the call and requested a return call on 11/24/19 on her day off.   Outpatient Encounter Medications as of 11/23/2019  Medication Sig  . clindamycin (CLEOCIN T) 1 % lotion Apply 1 application topically daily.  Marland Kitchen levonorgestrel (MIRENA) 20 MCG/24HR IUD 1 each by Intrauterine route once.  . sertraline (ZOLOFT) 100 MG tablet Take 1 tablet (100 mg total) by mouth daily.   No facility-administered encounter medications on file as of 11/23/2019.    Goals Addressed   None     Follow Up Plan: Appointment scheduled for SW follow up with client by phone on: 11/24/19 between 2pm and Elizabeth Marks, [place] Worker  [facility] [phone]

## 2019-11-24 ENCOUNTER — Telehealth: Payer: Self-pay | Admitting: *Deleted

## 2019-11-24 ENCOUNTER — Ambulatory Visit: Payer: Self-pay | Admitting: *Deleted

## 2019-11-24 DIAGNOSIS — F419 Anxiety disorder, unspecified: Secondary | ICD-10-CM

## 2019-11-25 NOTE — Chronic Care Management (AMB) (Signed)
  Chronic Care Management    Clinical Social Work Follow Up Note  11/25/2019 Name: Elizabeth Marks MRN: [id number] DOB: 11/19/1980  Elizabeth Marks is a 39 y.o. year old female who is a primary care patient of Elizabeth Marks, [place]. The CCM team was consulted for assistance with Mental Health Marks and Resources.   Review of patient status, including review of consultants reports, other relevant assessments, and collaboration with appropriate care team members and the patient's provider was performed as part of comprehensive patient evaluation and provision of chronic care management services.    Advanced Directives Status: <no information> See Care Plan for related entries.   Outpatient Encounter Medications as of 11/24/2019  Medication Sig  . clindamycin (CLEOCIN T) 1 % lotion Apply 1 application topically daily.  Marland Kitchen levonorgestrel (MIRENA) 20 MCG/24HR IUD 1 each by Intrauterine route once.  . sertraline (ZOLOFT) 100 MG tablet Take 1 tablet (100 mg total) by mouth daily.   No facility-administered encounter medications on file as of 11/24/2019.     Goals Addressed            This Visit's Progress   . "I would like a therapist"       Current Barriers:  Marland Kitchen Mental Health Concerns   Clinical Social Work Clinical Goal(s):  Marland Kitchen Over the next 90 days, patient will follow up with a local mental health provider of choice* as directed by SW  Interventions: . Patient interviewed and appropriate assessments performed . Confirmed need for ongoing mental health treatment to treat anxiety . Provided patient with list of outpatient providers including Elizabeth Marks [phone], Elizabeth Marks [phone], Elizabeth Marks [phone] and Elizabeth Marks [phone] . Discussed plans with patient for ongoing care management follow up and provided patient with direct contact information for care management team  Patient Self Care  Activities:  . Patient verbalizes understanding of plan to contact a mental health therapist to schedule and initial appointment . Knowledge deficit of local mental health therapist  Initial goal documentation         Follow Up Plan: SW will follow up with patient by phone over the next 2 weeks    Elizabeth Marks, [place] Worker  [facility] [phone]

## 2019-11-25 NOTE — Patient Instructions (Addendum)
Thank you allowing the Chronic Care Management Team to be a part of your care! It was a pleasure speaking with you today!  1. Please call to schedule an initial appointment with a mental therapist. 2. Please call this social worker with any questions or concerns regarding your mental health needs  CCM (Chronic Care Management) Team   Neldon Labella RN, BSN Nurse Care Coordinator  [phone]  Ruben Reason PharmD  Clinical Pharmacist  [phone]   [employer], [place] Social Worker [phone]  Goals Addressed            This Visit's Progress   . "I would like a therapist"       Current Barriers:  Marland Kitchen Mental Health Concerns   Clinical Social Work Clinical Goal(s):  Marland Kitchen Over the next 90 days, patient will follow up with a local mental health provider of choice* as directed by SW  Interventions: . Patient interviewed and appropriate assessments performed . Confirmed need for ongoing mental health treatment to treat anxiety . Provided patient with list of outpatient providers including Jeri Lager [phone], [facility] [phone], [facility] [phone] and [facility] [phone] . Discussed plans with patient for ongoing care management follow up and provided patient with direct contact information for care management team  Patient Self Care Activities:  . Patient verbalizes understanding of plan to contact a mental health therapist to schedule and initial appointment . Knowledge deficit of local mental health therapist  Initial goal documentation         The patient verbalized understanding of instructions provided today and declined a print copy of patient instruction materials.   Telephone follow up appointment with care management team member scheduled for: 12/08/19

## 2019-12-08 ENCOUNTER — Ambulatory Visit: Payer: Self-pay | Admitting: *Deleted

## 2019-12-08 DIAGNOSIS — F419 Anxiety disorder, unspecified: Secondary | ICD-10-CM

## 2019-12-08 NOTE — Chronic Care Management (AMB) (Signed)
   Care Management    Clinical Social Work Follow Up Note  12/08/2019 Name: Elizabeth Marks MRN: [id number] DOB: [birth year]  Elizabeth Marks is a 39 y.o. year old female who is a primary care patient of Elizabeth Marks, [place]. The CCM team was consulted for assistance with Mental Health Counseling and Resources.   Review of patient status, including review of consultants reports, other relevant assessments, and collaboration with appropriate care team members and the patient's provider was performed as part of comprehensive patient evaluation and provision of chronic care management services.    Advanced Directives Status: <no information> See Care Plan for related entries.   Outpatient Encounter Medications as of 12/08/2019  Medication Sig  . clindamycin (CLEOCIN T) 1 % lotion Apply 1 application topically daily.  Marland Kitchen levonorgestrel (MIRENA) 20 MCG/24HR IUD 1 each by Intrauterine route once.  . sertraline (ZOLOFT) 100 MG tablet Take 1 tablet (100 mg total) by mouth daily.   No facility-administered encounter medications on file as of 12/08/2019.     Goals Addressed            This Visit's Progress   . "I would like a therapist"       Current Barriers:  Marland Kitchen Mental Health Concerns   Clinical Social Work Clinical Goal(s):  Marland Kitchen Over the next 90 days, patient will follow up with a local mental health provider of choice* as directed by SW  Interventions: . Patient interviewed and appropriate assessments performed . Confirmed continued need for ongoing mental health treatment to treat anxiety . Confirmed that patient  has not contacted the therapist from list provided including Jeri Lager [phone], [facility] [phone], [facility] [phone] and [facility] [phone] . Patient discussed being overwhelmed with tasks since her father was admitted to the hospital . This social worker encouraged self care and  making mental health a priority . Discussed plans with patient for ongoing care management follow up and provided patient with direct contact information for care management team  Patient Self Care Activities:  . Patient verbalizes understanding of plan to contact a mental health therapist to schedule and initial appointment . Knowledge deficit of local mental health therapist  Initial goal documentation         Follow Up Plan: SW will follow up with patient by phone over the next 3 weeks regarding follow up with a mental health therapist for ongoing mental health follow up    Elliot Gurney, [place] Worker  [facility] [phone]

## 2019-12-08 NOTE — Patient Instructions (Signed)
Thank you allowing the Chronic Care Management Team to be a part of your care! It was a pleasure speaking with you today!  1. Please call to schedule the initial assessment with a mental health therapist of choice for ongoing follow up  CCM (Chronic Care Management) Team   Neldon Labella  RN, BSN Nurse Care Coordinator  [phone]  Ruben Reason PharmD  Clinical Pharmacist  [phone]   [place], [place] Social Worker [phone]  Goals Addressed            This Visit's Progress   . "I would like a therapist"       Current Barriers:  Marland Kitchen Mental Health Concerns   Clinical Social Work Clinical Goal(s):  Marland Kitchen Over the next 90 days, patient will follow up with a local mental health provider of choice* as directed by SW  Interventions: . Patient interviewed and appropriate assessments performed . Confirmed continued need for ongoing mental health treatment to treat anxiety . Confirmed that patient  has not contacted the therapist from list provided including Jeri Lager [phone], [facility] [phone], [facility] [phone] and [facility] [phone] . Patient discussed being overwhelmed with tasks since her father was admitted to the hospital . This social worker encouraged self care and making mental health a priority . Discussed plans with patient for ongoing care management follow up and provided patient with direct contact information for care management team  Patient Self Care Activities:  . Patient verbalizes understanding of plan to contact a mental health therapist to schedule and initial appointment . Knowledge deficit of local mental health therapist  Initial goal documentation         The patient verbalized understanding of instructions provided today and declined a print copy of patient instruction materials.   Telephone follow up appointment with care management  team member scheduled for: 12/24/19

## 2019-12-24 ENCOUNTER — Telehealth: Payer: Self-pay | Admitting: *Deleted

## 2020-01-03 ENCOUNTER — Encounter: Payer: BLUE CROSS/BLUE SHIELD | Admitting: Obstetrics and Gynecology

## 2020-01-08 ENCOUNTER — Ambulatory Visit: Payer: Self-pay | Admitting: *Deleted

## 2020-01-08 ENCOUNTER — Encounter: Payer: Self-pay | Admitting: *Deleted

## 2020-01-08 DIAGNOSIS — F419 Anxiety disorder, unspecified: Secondary | ICD-10-CM

## 2020-01-08 NOTE — Chronic Care Management (AMB) (Deleted)
  Chronic Care Management    Clinical Social Work Follow Up Note  01/08/2020 Name: AHLIVIA GAINS MRN: [id number] DOB: 03-12-1981  Elizabeth Marks is a 39 y.o. year old female who is a primary care patient of Trinna Post, [place]. The CCM team was consulted for assistance with {CCM SW CONSULT REASONS:22330}.   Review of patient status, including review of consultants reports, other relevant assessments, and collaboration with appropriate care team members and the patient's provider was performed as part of comprehensive patient evaluation and provision of chronic care management services.    SDOH (Social Determinants of Health) assessments performed: {yes/no:20286}    Outpatient Encounter Medications as of 01/08/2020  Medication Sig  . clindamycin (CLEOCIN T) 1 % lotion Apply 1 application topically daily.  Marland Kitchen levonorgestrel (MIRENA) 20 MCG/24HR IUD 1 each by Intrauterine route once.  . sertraline (ZOLOFT) 100 MG tablet Take 1 tablet (100 mg total) by mouth daily.   No facility-administered encounter medications on file as of 01/08/2020.     Goals Addressed            This Visit's Progress   . "I would like a therapist"       Current Barriers:  Marland Kitchen Mental Health Concerns   Clinical Social Work Clinical Goal(s):  Marland Kitchen Over the next 90 days, patient will follow up with a local mental health provider of choice* as directed by SW  Interventions: . Followed up on continued need for ongoing mental health treatment to treat anxiety . Confirmed that patient  has not contacted the therapist from list provided, however due to improvement in mood states that she is going to wait approximately 30 days to see if her mood stabilizes . Patient confirmed that she is feeling better and states that the medication is working well "I have no complaints" . Patient discussed plan to possibly contact [facility] for mental health follow up if needed . Patient further discussed plan to schedule a  follow up appointment with her primary care provider . Patient provided with encouragement and positive reinforcement to follow up with a mental health provided when needed . Discussed plans with patient for ongoing care management follow up and provided patient with direct contact information for care management team if needed in the future  Patient Self Care Activities:  . Patient verbalizes understanding of plan to contact a mental health therapist to schedule and initial appointment . Knowledge deficit of local mental health therapist  Please see past updates related to this goal by clicking on the "Past Updates" button in the selected goal          Follow Up Plan: Client will reach out to this social worker with any questions concerns regarding her mental health follow up    Elliot Gurney, [place] Worker  [facility] [phone]

## 2020-01-08 NOTE — Progress Notes (Signed)
  This encounter was created in error - please disregard.  This encounter was created in error - please disregard.  This encounter was created in error - please disregard.  This encounter was created in error - please disregard. 

## 2020-01-08 NOTE — Chronic Care Management (AMB) (Signed)
   Care Management    Clinical Social Work Follow Up Note  01/08/2020 Name: Elizabeth Marks MRN: [id number] DOB: 05/08/1981  Elizabeth Marks is a 39 y.o. year old female who is a primary care patient of Trinna Post, [place]. The CCM team was consulted for assistance with Mental Health Counseling and Resources.   Review of patient status, including review of consultants reports, other relevant assessments, and collaboration with appropriate care team members and the patient's provider was performed as part of comprehensive patient evaluation and provision of chronic care management services.    SDOH (Social Determinants of Health) assessments performed: No    Outpatient Encounter Medications as of 01/08/2020  Medication Sig  . clindamycin (CLEOCIN T) 1 % lotion Apply 1 application topically daily.  Marland Kitchen levonorgestrel (MIRENA) 20 MCG/24HR IUD 1 each by Intrauterine route once.  . sertraline (ZOLOFT) 100 MG tablet Take 1 tablet (100 mg total) by mouth daily.   No facility-administered encounter medications on file as of 01/08/2020.     Goals Addressed            This Visit's Progress   . "I would like a therapist"       Current Barriers:  Marland Kitchen Mental Health Concerns   Clinical Social Work Clinical Goal(s):  Marland Kitchen Over the next 90 days, patient will follow up with a local mental health provider of choice* as directed by SW  Interventions: . Followed up on continued need for ongoing mental health treatment to treat anxiety . Confirmed that patient  has not contacted the therapist from list provided, however due to improvement in mood states that she is going to wait approximately 30 days to see if her mood stabilizes . Patient confirmed that she is feeling better and states that the medication seems to be working well "I have no complaints" . Patient discussed plan to possibly contact [facility] for mental health follow up if needed . Patient further discussed plan to schedule a  follow up appointment with her primary care provider . Patient provided with encouragement and positive reinforcement to follow up with a mental health provided when needed . Discussed plans with patient for ongoing care management follow up and provided patient with direct contact information for care management team if needed in the future  Patient Self Care Activities:  . Patient verbalizes understanding of plan to contact a mental health therapist to schedule and initial appointment . Knowledge deficit of local mental health therapist  Please see past updates related to this goal by clicking on the "Past Updates" button in the selected goal          Follow Up Plan: Client will contact this social worker with questions or concerns regarding your mental health follow up    Elliot Gurney, [place] Worker  [facility] [phone]

## 2020-01-08 NOTE — Patient Instructions (Signed)
Thank you allowing the Chronic Care Management Team to be a part of your care! It was a pleasure speaking with you today!  1. Please follow up with the mental health of your choice when needed  CCM (Chronic Care Management) Team   Neldon Labella RN, BSN Nurse Care Coordinator  [phone]  Ruben Reason PharmD  Clinical Pharmacist  [phone]   [place], LCSW Clinical Social Worker [phone]  Goals Addressed            This Visit's Progress   . "I would like a therapist"       Current Barriers:  Marland Kitchen Mental Health Concerns   Clinical Social Work Clinical Goal(s):  Marland Kitchen Over the next 90 days, patient will follow up with a local mental health provider of choice* as directed by SW  Interventions: . Followed up on continued need for ongoing mental health treatment to treat anxiety . Confirmed that patient  has not contacted the therapist from list provided, however due to improvement in mood states that she is going to wait approximately 30 days to see if her mood stabilizes . Patient confirmed that she is feeling better and states that the medication seems to be working well "I have no complaints" . Patient discussed plan to possibly contact [facility] for mental health follow up if needed . Patient further discussed plan to schedule a follow up appointment with her primary care provider . Patient provided with encouragement and positive reinforcement to follow up with a mental health provided when needed . Discussed plans with patient for ongoing care management follow up and provided patient with direct contact information for care management team if needed in the future  Patient Self Care Activities:  . Patient verbalizes understanding of plan to contact a mental health therapist to schedule and initial appointment . Knowledge deficit of local mental health therapist  Please see past updates related to this goal by clicking on the "Past Updates" button in  the selected goal          The patient verbalized understanding of instructions provided today and declined a print copy of patient instruction materials.   No further follow up required: patient to contact this Education officer, [employer] with questions or concerns regarding your mental health needs

## 2020-01-17 ENCOUNTER — Ambulatory Visit (INDEPENDENT_AMBULATORY_CARE_PROVIDER_SITE_OTHER): Payer: BLUE CROSS/BLUE SHIELD | Admitting: Physician Assistant

## 2020-01-17 DIAGNOSIS — R52 Pain, unspecified: Secondary | ICD-10-CM | POA: Diagnosis not present

## 2020-01-17 NOTE — Progress Notes (Signed)
       Patient: LETETIA TANGO Female    DOB: 1981/09/17   39 y.o.   MRN: [id number] Visit Date: 01/17/2020  Today's Provider: Trinna Post, PA-C   Chief Complaint  Patient presents with  . Generalized Body Aches   Subjective:    Virtual Visit via Telephone Note  I connected with Nayomie Eltringham Hird on 01/17/20 at 11:20 AM EDT by telephone and verified that I am speaking with the correct person using two identifiers.  Location: Patient: Home Provider: Office   I discussed the limitations, risks, security and privacy concerns of performing an evaluation and management service by telephone and the availability of in person appointments. I also discussed with the patient that there may be a patient responsible charge related to this service. The patient expressed understanding and agreed to proceed.  HPI  She got her first COVID shot 2 weeks ago. Symptoms started two days ago and patient is reporting body aches. She denies fevers, SOB, cough. She reports headaches. She denies nausea, vomiting. Denies sick contacts.    Allergies  Allergen Reactions  . Corn-Containing Products     Abdominal pain  . Shellfish Allergy     Abdominal pain  . Adhesive [Tape] Rash  . Latex Rash     Current Outpatient Medications:  .  clindamycin (CLEOCIN T) 1 % lotion, Apply 1 application topically daily., Disp: , Rfl:  .  levonorgestrel (MIRENA) 20 MCG/24HR IUD, 1 each by Intrauterine route once., Disp: , Rfl:  .  sertraline (ZOLOFT) 100 MG tablet, Take 1 tablet (100 mg total) by mouth daily., Disp: 90 tablet, Rfl: 1  Review of Systems  Social History   Tobacco Use  . Smoking status: Current Every Day Smoker    Packs/day: 0.25    Types: Cigarettes  . Smokeless tobacco: Never Used  Substance Use Topics  . Alcohol use: Yes    Alcohol/week: 1.0 standard drinks    Types: 1 Standard drinks or equivalent per week      Objective:   There were no vitals taken for this visit. There  were no vitals filed for this visit.There is no height or weight on file to calculate BMI.   Physical Exam   No results found for any visits on 01/17/20.     Assessment & Plan    1. Body aches  Counseled on various infectious etiologies and quarantine precuations. Advised on how to get covid tested. Return precautions counseled.    I discussed the assessment and treatment plan with the patient. The patient was provided an opportunity to ask questions and all were answered. The patient agreed with the plan and demonstrated an understanding of the instructions.   The patient was advised to call back or seek an in-person evaluation if the symptoms worsen or if the condition fails to improve as anticipated.  I provided 15 minutes of non-face-to-face time during this encounter.  The entirety of the information documented in the History of Present Illness, Review of Systems and Physical Exam were personally obtained by me. Portions of this information were initially documented by [facility] and reviewed by me for thoroughness and accuracy.      Trinna Post, PA-C  [facility]

## 2020-01-19 ENCOUNTER — Other Ambulatory Visit: Payer: Self-pay

## 2020-01-19 ENCOUNTER — Ambulatory Visit (INDEPENDENT_AMBULATORY_CARE_PROVIDER_SITE_OTHER): Payer: BLUE CROSS/BLUE SHIELD | Admitting: Obstetrics and Gynecology

## 2020-01-19 ENCOUNTER — Other Ambulatory Visit (HOSPITAL_COMMUNITY)
Admission: RE | Admit: 2020-01-19 | Discharge: 2020-01-19 | Disposition: A | Discharge status: Home / Self Care | Payer: BLUE CROSS/BLUE SHIELD | Source: Ambulatory Visit | Attending: Obstetrics and Gynecology | Admitting: Obstetrics and Gynecology

## 2020-01-19 ENCOUNTER — Encounter: Payer: Self-pay | Admitting: Obstetrics and Gynecology

## 2020-01-19 VITALS — BP 113/78 | HR 93 | Ht 62.0 in | Wt 177.8 lb

## 2020-01-19 DIAGNOSIS — D069 Carcinoma in situ of cervix, unspecified: Secondary | ICD-10-CM | POA: Insufficient documentation

## 2020-01-19 NOTE — Addendum Note (Signed)
Addended by: Durwin Glaze on: 01/19/2020 01:39 PM   Modules accepted: Orders

## 2020-01-19 NOTE — Progress Notes (Signed)
HPI:      [name] is a 39 y.o. [phone] who LMP was No LMP recorded. (Menstrual status: IUD).  Subjective:   She presents today for 56-month follow-up after LEEP and IUD placement.  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  She reports no difficulties or problems.  She has not bleeding with her IUD.  She has had multiple previous IUD successfully.    Hx: The following portions of the patient's history were reviewed and updated as appropriate:             She  has a past medical history of Allergy and Anxiety (04/07/2019). She does not have any pertinent problems on file. She  has a past surgical history that includes Cesarean section; Tonsillectomy and adenoidectomy; Wisdom tooth extraction; LEEP (N/A, 08/25/2019); IUD removal (08/25/2019); and Intrauterine device (iud) insertion (08/25/2019). Her family history includes Aneurysm in her maternal aunt; Anxiety disorder in her son; Cancer in her father and sister; Diabetes in her mother; Fibroids in her mother; Kidney disease in her maternal aunt. She  reports that she has been smoking cigarettes. She has been smoking about 0.25 packs per day. She has never used smokeless tobacco. She reports current alcohol use of about 1.0 standard drinks of alcohol per week. She reports current drug use. Drugs: Other-see comments and Marijuana. She has a current medication list which includes the following prescription(s): clindamycin, levonorgestrel, and sertraline. She is allergic to corn-containing products; shellfish allergy; adhesive [tape]; and latex.       Review of Systems:  Review of Systems  Constitutional: Denied constitutional symptoms, night sweats, recent illness, fatigue, fever, insomnia and weight loss.  Eyes: Denied eye symptoms, eye pain, photophobia, vision change and visual disturbance.  Ears/Nose/Throat/Neck: Denied ear, nose, throat or neck symptoms, hearing loss, nasal discharge, sinus congestion and sore throat.   Cardiovascular: Denied cardiovascular symptoms, arrhythmia, chest pain/pressure, edema, exercise intolerance, orthopnea and palpitations.  Respiratory: Denied pulmonary symptoms, asthma, pleuritic pain, productive sputum, cough, dyspnea and wheezing.  Gastrointestinal: Denied, gastro-esophageal reflux, melena, nausea and vomiting.  Genitourinary: Denied genitourinary symptoms including symptomatic vaginal discharge, pelvic relaxation issues, and urinary complaints.  Musculoskeletal: Denied musculoskeletal symptoms, stiffness, swelling, muscle weakness and myalgia.  Dermatologic: Denied dermatology symptoms, rash and scar.  Neurologic: Denied neurology symptoms, dizziness, headache, neck pain and syncope.  Psychiatric: Denied psychiatric symptoms, anxiety and depression.  Endocrine: Denied endocrine symptoms including hot flashes and night sweats.   Meds:   Current Outpatient Medications on File Prior to Visit  Medication Sig Dispense Refill  . clindamycin (CLEOCIN T) 1 % lotion Apply 1 application topically daily.    Marland Kitchen levonorgestrel (MIRENA) 20 MCG/24HR IUD 1 each by Intrauterine route once.    . sertraline (ZOLOFT) 100 MG tablet Take 1 tablet (100 mg total) by mouth daily. 90 tablet 1   No current facility-administered medications on file prior to visit.    Objective:     Vitals:   01/19/20 1111  BP: 113/78  Pulse: 93              Physical examination   Pelvic:   Vulva: Normal appearance.  No lesions.  Vagina: No lesions or abnormalities noted.  Support: Normal pelvic support.  Urethra No masses tenderness or scarring.  Meatus Normal size without lesions or prolapse.  Cervix:  Obviously status post procedure no lesions. IUD strings noted at cervical os.  Anus: Normal exam.  No lesions.  Perineum: Normal exam.  No lesions.  Bimanual   Uterus: Normal size.  Non-tender.  Mobile.  AV.  Adnexae: No masses.  Non-tender to palpation.  Cul-de-sac: Negative for  abnormality.     Assessment:    [id number] Patient Active Problem List   Diagnosis Date Noted  . Hydradenitis 08/08/2019  . Abdominal pain 07/28/2019  . 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 Galactorrhea 05/18/2019  . Anxiety 04/07/2019  . Bloating 04/07/2019     1. High grade squamous intraepithelial lesion (HGSIL), grade 3 CIN, on biopsy of cervix     Status post LEEP-4 months ago.  This is her first Pap after LEEP.   Plan:            1.  Pap performed  2.  Follow-up for next Pap in 6 months. Orders No orders of the defined types were placed in this encounter.   No orders of the defined types were placed in this encounter.     F/U  Return in about 6 months (around 07/21/2020). I spent 21 minutes involved in the care of this patient preparing to see the patient by obtaining and reviewing her medical history (including labs, imaging tests and prior procedures), documenting clinical information in the electronic health record (EHR), counseling and coordinating care plans, writing and sending prescriptions, ordering tests or procedures and directly communicating with the patient by discussing pertinent items from her history and physical exam as well as detailing my assessment and plan as noted above so that she has an informed understanding.  All of her questions were answered.  Finis Bud, M.D. 01/19/2020 11:38 AM

## 2020-01-25 LAB — CYTOLOGY - PAP
Comment: NEGATIVE
Comment: NEGATIVE
Comment: NEGATIVE
HPV 16: NEGATIVE
HPV 18 / 45: NEGATIVE
High risk HPV: POSITIVE — AB

## 2020-02-28 ENCOUNTER — Encounter: Payer: Self-pay | Admitting: Physician Assistant

## 2020-02-28 ENCOUNTER — Other Ambulatory Visit: Payer: Self-pay

## 2020-02-28 ENCOUNTER — Ambulatory Visit (INDEPENDENT_AMBULATORY_CARE_PROVIDER_SITE_OTHER): Payer: BLUE CROSS/BLUE SHIELD | Admitting: Physician Assistant

## 2020-02-28 VITALS — BP 122/86 | HR 80 | Temp 97.1°F | Wt 182.0 lb

## 2020-02-28 DIAGNOSIS — F419 Anxiety disorder, unspecified: Secondary | ICD-10-CM

## 2020-02-28 DIAGNOSIS — Z862 Personal history of diseases of the blood and blood-forming organs and certain disorders involving the immune mechanism: Secondary | ICD-10-CM

## 2020-02-28 DIAGNOSIS — R5383 Other fatigue: Secondary | ICD-10-CM

## 2020-02-28 MED ORDER — ESCITALOPRAM OXALATE 20 MG PO TABS: 20.0000 mg | ORAL_TABLET | Freq: Every day | ORAL | 0 refills | Status: DC

## 2020-02-28 NOTE — Progress Notes (Signed)
Established patient visit   Patient: Elizabeth Marks   DOB: 03-20-1981   39 y.o. Female  MRN: [id number] Visit Date: 02/28/2020  Today's healthcare provider: Trinna Post, PA-C   Chief Complaint  Patient presents with  . Fatigue  . Nausea  . Hemorrhoids  I,Chrles Selley M Sachin Ferencz,acting as a scribe for [employer], PA-C.,have documented all relevant documentation on the behalf of Trinna Post, PA-C,as directed by  Trinna Post, PA-C while in the presence of Trinna Post, PA-C.  Subjective    HPI  Fatigue Patient reports fatigue and nausea for about 1 month now. She states that she noticed the symptoms right after getting her covid vaccine on 01/20/2020. 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 Patient had last TSH checked on 05/17/2019 in was in normal range, but T3 uptake ratio was low. She reports that she noticed eating a lot. She normally goes to bed before midnight and wake up around 6:30 AM -7:00 AM. She denies snoring and falling asleep throughout the day.  Results of the [facility] flowsheet 02/28/2020  Sitting and reading 3  Watching TV 0  Sitting, inactive in a public place (e.g. a theatre or a meeting) 0  As a passenger in a car for an hour without a break 0  Lying down to rest in the afternoon when circumstances permit 3  Sitting and talking to someone 0  Sitting quietly after a lunch without alcohol 0  In a car, while stopped for a few minutes in traffic 0  Total score 6     Anxiety Patient was last seen for anxiety on 11/15/2019 and her Zoloft was increased to 100 MG daily.  She did not notice any difference after increasing Zoloft. 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 GAD 7 : Generalized Anxiety Score 11/15/2019  Nervous, Anxious, on Edge 1  Control/stop worrying 2  Worry too much - different things 2  Trouble relaxing 1    Restless 0  Easily annoyed or irritable 1  Afraid - awful might happen 1  Total GAD 7 Score 8  Anxiety Difficulty Somewhat difficult    Hemorrhoids Patient reports she has hemorrhoids and would consider seeing a general surgeon in the future.     Medications: Outpatient Medications Prior to Visit  Medication Sig  . clindamycin (CLEOCIN T) 1 % lotion Apply 1 application topically daily.  Marland Kitchen levonorgestrel (MIRENA) 20 MCG/24HR IUD 1 each by Intrauterine route once.  . sertraline (ZOLOFT) 100 MG tablet Take 1 tablet (100 mg total) by mouth daily.   No facility-administered medications prior to visit.    Review of Systems  Constitutional: Positive for activity change and fatigue. Negative for appetite change.  Cardiovascular: Negative.   Gastrointestinal: Positive for nausea. Negative for abdominal pain, constipation, diarrhea and vomiting.  Neurological: Positive for headaches (sometimes per pt). Negative for weakness and light-headedness.      Objective    BP 122/86 (BP Location: Left Arm, Patient Position: Sitting, Cuff Size: Normal)   Pulse 80   Temp (!) 97.1 F (36.2 C) (Temporal)   Wt 182 lb (82.6 kg)   SpO2 96%   BMI 33.29 kg/m    Physical Exam Cardiovascular:     Rate and Rhythm: Normal rate and regular rhythm.     Pulses: Normal pulses.     Heart sounds:  Normal heart sounds.  Pulmonary:     Effort: Pulmonary effort is normal.     Breath sounds: Normal breath sounds.  Skin:    General: Skin is warm and dry.  Neurological:     General: No focal deficit present.     Mental Status: She is oriented to person, place, and time.  Psychiatric:        Mood and Affect: Mood normal.        Behavior: Behavior normal.       No results found for any visits on 02/28/20.  Assessment & Plan    1. Anxiety Patient was switched from Sertraline 100 MG to Lexapro 20 MG daily as she has doe well on this in the past.   - escitalopram (LEXAPRO) 20 MG tablet; Take 1  tablet (20 mg total) by mouth daily.  Dispense: 90 tablet; Refill: 0  2. Fatigue, unspecified type Patient reports fatigue for 1 month, unknown cause currently.   Labs as below and considered a sleep study My have element of mood disorder - TSH - Comprehensive metabolic panel - CBC with Differential/Platelet - Iron, TIBC and Ferritin Panel  3. History of anemia Patient reports last known anemia was 20 years ago. - TSH - Comprehensive metabolic panel - CBC with Differential/Platelet - Iron, TIBC and Ferritin Panel   Return in about 2 months (around 04/29/2020) for Anxiety.      ITrinna Post, PA-C, have reviewed all documentation for this visit. The documentation on 02/28/20 for the exam, diagnosis, procedures, and orders are all accurate and complete.    Paulene Floor  [facility] [phone] (phone) [phone] (fax)  [place]

## 2020-02-29 LAB — COMPREHENSIVE METABOLIC PANEL WITH GFR
ALT: 14 IU/L (ref 0–32)
AST: 18 IU/L (ref 0–40)
Albumin/Globulin Ratio: 1.7 (ref 1.2–2.2)
Albumin: 4.3 g/dL (ref 3.8–4.8)
Alkaline Phosphatase: 114 IU/L (ref 39–117)
BUN/Creatinine Ratio: 9 (ref 9–23)
BUN: 7 mg/dL (ref 6–20)
Bilirubin Total: 0.3 mg/dL (ref 0.0–1.2)
CO2: 22 mmol/L (ref 20–29)
Calcium: 9.6 mg/dL (ref 8.7–10.2)
Chloride: 104 mmol/L (ref 96–106)
Creatinine, Ser: 0.8 mg/dL (ref 0.57–1.00)
GFR calc Af Amer: 107 mL/min/1.73
GFR calc non Af Amer: 93 mL/min/1.73
Globulin, Total: 2.6 g/dL (ref 1.5–4.5)
Glucose: 79 mg/dL (ref 65–99)
Potassium: 4.4 mmol/L (ref 3.5–5.2)
Sodium: 140 mmol/L (ref 134–144)
Total Protein: 6.9 g/dL (ref 6.0–8.5)

## 2020-02-29 LAB — IRON,TIBC AND FERRITIN PANEL
Ferritin: 178 ng/mL — ABNORMAL HIGH (ref 15–150)
Iron Saturation: 21 % (ref 15–55)
Iron: 62 ug/dL (ref 27–159)
Total Iron Binding Capacity: 291 ug/dL (ref 250–450)
UIBC: 229 ug/dL (ref 131–425)

## 2020-02-29 LAB — CBC WITH DIFFERENTIAL/PLATELET
Basophils Absolute: 0 x10E3/uL (ref 0.0–0.2)
Basos: 0 %
EOS (ABSOLUTE): 0.3 x10E3/uL (ref 0.0–0.4)
Eos: 3 %
Hematocrit: 40 % (ref 34.0–46.6)
Hemoglobin: 14 g/dL (ref 11.1–15.9)
Immature Grans (Abs): 0 x10E3/uL (ref 0.0–0.1)
Immature Granulocytes: 0 %
Lymphocytes Absolute: 2.1 x10E3/uL (ref 0.7–3.1)
Lymphs: 26 %
MCH: 31.5 pg (ref 26.6–33.0)
MCHC: 35 g/dL (ref 31.5–35.7)
MCV: 90 fL (ref 79–97)
Monocytes Absolute: 0.8 x10E3/uL (ref 0.1–0.9)
Monocytes: 10 %
Neutrophils Absolute: 5.1 x10E3/uL (ref 1.4–7.0)
Neutrophils: 61 %
Platelets: 323 x10E3/uL (ref 150–450)
RBC: 4.45 x10E6/uL (ref 3.77–5.28)
RDW: 12.8 % (ref 11.7–15.4)
WBC: 8.3 x10E3/uL (ref 3.4–10.8)

## 2020-02-29 LAB — TSH: TSH: 0.737 u[IU]/mL (ref 0.450–4.500)

## 2020-04-11 ENCOUNTER — Telehealth: Payer: Self-pay | Admitting: Obstetrics and Gynecology

## 2020-04-11 NOTE — Telephone Encounter (Signed)
Patient called in stating she hast he mirena IUD and she started her period. Patient stated shes been throwing up all morning. Informed patient that Dr. Amalia Hailey is out of the office the remainder of the week and that we only had one provider in today and she was overbooked. Informed patient that if anything worsened she could make an appointment with her PCP or she could go to the ED. Patient verbalized understanding.

## 2020-04-12 ENCOUNTER — Ambulatory Visit
Admission: RE | Admit: 2020-04-12 | Discharge: 2020-04-12 | Disposition: A | Discharge status: Home / Self Care | Payer: 59 | Source: Ambulatory Visit | Attending: Physician Assistant | Admitting: Physician Assistant

## 2020-04-12 ENCOUNTER — Ambulatory Visit (INDEPENDENT_AMBULATORY_CARE_PROVIDER_SITE_OTHER): Payer: Self-pay | Admitting: Physician Assistant

## 2020-04-12 ENCOUNTER — Encounter: Payer: Self-pay | Admitting: Physician Assistant

## 2020-04-12 ENCOUNTER — Other Ambulatory Visit: Payer: Self-pay

## 2020-04-12 VITALS — BP 112/65 | HR 94 | Temp 96.9°F | Resp 16 | Wt 182.8 lb

## 2020-04-12 DIAGNOSIS — R140 Abdominal distension (gaseous): Secondary | ICD-10-CM | POA: Insufficient documentation

## 2020-04-12 DIAGNOSIS — R110 Nausea: Secondary | ICD-10-CM | POA: Diagnosis present

## 2020-04-12 DIAGNOSIS — R1084 Generalized abdominal pain: Secondary | ICD-10-CM | POA: Diagnosis present

## 2020-04-12 DIAGNOSIS — K5900 Constipation, unspecified: Secondary | ICD-10-CM

## 2020-04-12 MED ORDER — ONDANSETRON HCL 4 MG PO TABS: 4.0000 mg | ORAL_TABLET | Freq: Three times a day (TID) | ORAL | 0 refills | Status: DC | PRN

## 2020-04-12 NOTE — Patient Instructions (Signed)

## 2020-04-12 NOTE — Progress Notes (Signed)
Established patient visit   Patient: Elizabeth Marks   DOB: 1981-08-21   39 y.o. Female  MRN: [id number] Visit Date: 04/12/2020  Today's healthcare provider: Mar Daring, PA-C   Chief Complaint  Patient presents with  . Abdominal Pain   Subjective    Abdominal Pain This is a new problem. Episode onset: 2 days ago. The onset quality is sudden. The problem occurs constantly. The problem has been unchanged. The pain is located in the generalized abdominal region. The pain is severe. The quality of the pain is aching, cramping and sharp. The abdominal pain radiates to the epigastric region, suprapubic region and periumbilical region. Associated symptoms include constipation, nausea and vomiting. Pertinent negatives include no anorexia, arthralgias, belching, diarrhea, dysuria, fever, flatus, frequency, hematochezia, hematuria, melena, myalgias or weight loss. The pain is aggravated by certain positions, movement and palpation. The pain is relieved by being still. She has tried acetaminophen and oral narcotic analgesics for the symptoms. The treatment provided mild relief.    Patient Active Problem List   Diagnosis Date Noted  . Hydradenitis 08/08/2019  . Abdominal pain 07/28/2019  . 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 Galactorrhea 05/18/2019  . Anxiety 04/07/2019  . Bloating 04/07/2019   Social History   Tobacco Use  . Smoking status: Current Every Day Smoker    Packs/day: 0.25    Types: Cigarettes  . Smokeless tobacco: Never Used  Vaping Use  . Vaping Use: Never used  Substance Use Topics  . Alcohol use: Yes    Alcohol/week: 1.0 standard drink    Types: 1 Standard drinks or equivalent per week  . Drug use: Yes    Types: Other-see comments, Marijuana    Comment: CBD       Medications: Outpatient Medications Prior to Visit  Medication Sig  . clindamycin  (CLEOCIN T) 1 % lotion Apply 1 application topically daily.  Marland Kitchen escitalopram (LEXAPRO) 20 MG tablet Take 1 tablet (20 mg total) by mouth daily.  . fluticasone (FLONASE) 50 MCG/ACT nasal spray Place into the nose.  . levonorgestrel (MIRENA) 20 MCG/24HR IUD 1 each by Intrauterine route once.  . sertraline (ZOLOFT) 100 MG tablet Take 1 tablet (100 mg total) by mouth daily. (Patient not taking: Reported on 04/12/2020)   No facility-administered medications prior to visit.    Review of Systems  Constitutional: Negative for fever and weight loss.  Respiratory: Negative.   Cardiovascular: Negative.   Gastrointestinal: Positive for abdominal distention, abdominal pain, constipation, nausea and vomiting. Negative for anorexia, diarrhea, flatus, hematochezia and melena.  Genitourinary: Positive for pelvic pain. Negative for dysuria, frequency, hematuria, vaginal bleeding, vaginal discharge and vaginal pain.  Musculoskeletal: Negative for arthralgias and myalgias.    Last CBC Lab Results  Component Value Date   WBC 11.2 (H) 04/15/2020   HGB 13.5 04/15/2020   HCT 38.9 04/15/2020   MCV 88.4 04/15/2020   MCH 30.7 04/15/2020   RDW 12.7 04/15/2020   PLT 329 64/40/3474   Last metabolic panel Lab Results  Component Value Date   GLUCOSE 104 (H) 04/15/2020   NA 140 04/15/2020   K 3.7 04/15/2020   CL 106 04/15/2020   CO2 25 04/15/2020   BUN 9 04/15/2020   CREATININE 0.78 04/15/2020   GFRNONAA >60 04/15/2020   GFRAA >60 04/15/2020   CALCIUM 8.9 04/15/2020   PROT 8.0 04/15/2020  ALBUMIN 3.9 04/15/2020   LABGLOB 2.6 02/28/2020   AGRATIO 1.7 02/28/2020   BILITOT 0.6 04/15/2020   ALKPHOS 87 04/15/2020   AST 31 04/15/2020   ALT 27 04/15/2020   ANIONGAP 9 04/15/2020      Objective    BP 112/65 (BP Location: Left Arm, Patient Position: Sitting, Cuff Size: Normal)   Pulse 94   Temp (!) 96.9 F (36.1 C) (Temporal)   Resp 16   Wt 182 lb 12.8 oz (82.9 kg)   BMI 33.43 kg/m  BP Readings  from Last 3 Encounters:  04/15/20 120/70  04/12/20 112/65  02/28/20 122/86   Wt Readings from Last 3 Encounters:  04/15/20 183 lb (83 kg)  04/12/20 182 lb 12.8 oz (82.9 kg)  02/28/20 182 lb (82.6 kg)      Physical Exam Vitals reviewed.  Constitutional:      General: She is not in acute distress.    Appearance: Normal appearance. She is well-developed. She is obese. She is not ill-appearing or diaphoretic.  Cardiovascular:     Rate and Rhythm: Normal rate and regular rhythm.     Heart sounds: Normal heart sounds. No murmur heard.  No friction rub. No gallop.   Pulmonary:     Effort: Pulmonary effort is normal. No respiratory distress.     Breath sounds: Normal breath sounds. No wheezing or rales.  Abdominal:     General: Bowel sounds are decreased. There is distension.     Palpations: Abdomen is soft. There is no mass.     Tenderness: There is generalized abdominal tenderness. There is no right CVA tenderness, left CVA tenderness, guarding or rebound.     Hernia: No hernia is present.  Skin:    General: Skin is warm and dry.  Neurological:     Mental Status: She is alert and oriented to person, place, and time.      No results found for any visits on 04/12/20.  Assessment & Plan     1. Nausea Worsening symptoms of distension, tenderness and constipation. DDx: constipation, SBO, enteritis/colitis, gallbladder disease, appendicitis. Will give Zofran as below for nausea. Will get KUB to see if possible constipation. Trulance samples provided to patient for possible constipation. If KUB shows abnormal bowel gas will not start Trulance. Advised if pain worsens or does not improve may require further evaluation or ER if worsens over the weekend. I will f/u pending xray.  - ondansetron (ZOFRAN) 4 MG tablet; Take 1 tablet (4 mg total) by mouth every 8 (eight) hours as needed.  Dispense: 20 tablet; Refill: 0 - DG Abd 1 View; Future  2. Generalized abdominal pain See above medical  treatment plan. - DG Abd 1 View; Future  3. Constipation, unspecified constipation type See above medical treatment plan. - DG Abd 1 View; Future  4. Abdominal bloating See above medical treatment plan. - DG Abd 1 View; Future   Return if symptoms worsen or fail to improve.      Reynolds Bowl, PA-C, have reviewed all documentation for this visit. The documentation on 04/16/20 for the exam, diagnosis, procedures, and orders are all accurate and complete.   Rubye Beach  [facility] [phone] (phone) [phone] (fax)  [place]

## 2020-04-15 ENCOUNTER — Telehealth: Payer: Self-pay

## 2020-04-15 ENCOUNTER — Emergency Department
Admission: EM | Admit: 2020-04-15 | Discharge: 2020-04-15 | Disposition: A | Discharge status: Home / Self Care | Payer: 59 | Attending: Emergency Medicine | Admitting: Emergency Medicine

## 2020-04-15 ENCOUNTER — Other Ambulatory Visit: Payer: Self-pay

## 2020-04-15 ENCOUNTER — Encounter: Payer: Self-pay | Admitting: Radiology

## 2020-04-15 ENCOUNTER — Emergency Department: Payer: 59

## 2020-04-15 DIAGNOSIS — Z91040 Latex allergy status: Secondary | ICD-10-CM | POA: Insufficient documentation

## 2020-04-15 DIAGNOSIS — F17210 Nicotine dependence, cigarettes, uncomplicated: Secondary | ICD-10-CM | POA: Insufficient documentation

## 2020-04-15 DIAGNOSIS — N739 Female pelvic inflammatory disease, unspecified: Secondary | ICD-10-CM | POA: Insufficient documentation

## 2020-04-15 DIAGNOSIS — Z79899 Other long term (current) drug therapy: Secondary | ICD-10-CM | POA: Insufficient documentation

## 2020-04-15 DIAGNOSIS — R102 Pelvic and perineal pain: Secondary | ICD-10-CM

## 2020-04-15 DIAGNOSIS — R9389 Abnormal findings on diagnostic imaging of other specified body structures: Secondary | ICD-10-CM

## 2020-04-15 DIAGNOSIS — R1030 Lower abdominal pain, unspecified: Secondary | ICD-10-CM | POA: Diagnosis present

## 2020-04-15 DIAGNOSIS — N730 Acute parametritis and pelvic cellulitis: Secondary | ICD-10-CM

## 2020-04-15 LAB — CBC
HCT: 38.9 % (ref 36.0–46.0)
Hemoglobin: 13.5 g/dL (ref 12.0–15.0)
MCH: 30.7 pg (ref 26.0–34.0)
MCHC: 34.7 g/dL (ref 30.0–36.0)
MCV: 88.4 fL (ref 80.0–100.0)
Platelets: 329 10*3/uL (ref 150–400)
RBC: 4.4 MIL/uL (ref 3.87–5.11)
RDW: 12.7 % (ref 11.5–15.5)
WBC: 11.2 10*3/uL — ABNORMAL HIGH (ref 4.0–10.5)
nRBC: 0 % (ref 0.0–0.2)

## 2020-04-15 LAB — COMPREHENSIVE METABOLIC PANEL
ALT: 27 U/L (ref 0–44)
AST: 31 U/L (ref 15–41)
Albumin: 3.9 g/dL (ref 3.5–5.0)
Alkaline Phosphatase: 87 U/L (ref 38–126)
Anion gap: 9 (ref 5–15)
BUN: 9 mg/dL (ref 6–20)
CO2: 25 mmol/L (ref 22–32)
Calcium: 8.9 mg/dL (ref 8.9–10.3)
Chloride: 106 mmol/L (ref 98–111)
Creatinine, Ser: 0.78 mg/dL (ref 0.44–1.00)
GFR calc Af Amer: >60 mL/min (ref >60)
GFR calc non Af Amer: >60 mL/min (ref >60)
Glucose, Bld: 104 mg/dL — ABNORMAL HIGH (ref 70–99)
Potassium: 3.7 mmol/L (ref 3.5–5.1)
Sodium: 140 mmol/L (ref 135–145)
Total Bilirubin: 0.6 mg/dL (ref 0.3–1.2)
Total Protein: 8 g/dL (ref 6.5–8.1)

## 2020-04-15 LAB — URINALYSIS, COMPLETE (UACMP) WITH MICROSCOPIC
Bacteria, UA: NONE SEEN
Bilirubin Urine: NEGATIVE
Glucose, UA: NEGATIVE mg/dL
Ketones, ur: 5 mg/dL — AB
Nitrite: NEGATIVE
Protein, ur: 100 mg/dL — AB
RBC / HPF: >50 RBC/hpf — ABNORMAL HIGH (ref 0–5)
Specific Gravity, Urine: 1.031 — ABNORMAL HIGH (ref 1.005–1.030)
Squamous Epithelial / HPF: >50 — ABNORMAL HIGH (ref 0–5)
WBC, UA: >50 WBC/hpf — ABNORMAL HIGH (ref 0–5)
pH: 5 (ref 5.0–8.0)

## 2020-04-15 LAB — POCT PREGNANCY, URINE: Preg Test, Ur: NEGATIVE

## 2020-04-15 LAB — LIPASE, BLOOD: Lipase: 13 U/L (ref 11–51)

## 2020-04-15 LAB — CHLAMYDIA/NGC RT PCR (ARMC ONLY)
Chlamydia Tr: NOT DETECTED
N gonorrhoeae: DETECTED — AB

## 2020-04-15 MED ORDER — HYDROCODONE-ACETAMINOPHEN 5-325 MG PO TABS: 1.0000 | ORAL_TABLET | Freq: Four times a day (QID) | ORAL | 0 refills | Status: DC | PRN

## 2020-04-15 MED ORDER — METRONIDAZOLE 500 MG PO TABS
500.0000 mg | ORAL_TABLET | Freq: Two times a day (BID) | ORAL | 0 refills | Status: AC
Start: 2020-04-15 — End: 2020-04-29

## 2020-04-15 MED ORDER — DOXYCYCLINE HYCLATE 100 MG PO CAPS
100.0000 mg | ORAL_CAPSULE | Freq: Two times a day (BID) | ORAL | 0 refills | Status: DC
Start: 2020-04-15 — End: 2020-04-29

## 2020-04-15 MED ORDER — ONDANSETRON 4 MG PO TBDP
4.0000 mg | ORAL_TABLET | Freq: Three times a day (TID) | ORAL | 0 refills | Status: DC | PRN
Start: 2020-04-15 — End: 2021-01-30

## 2020-04-15 MED ORDER — IOHEXOL 300 MG/ML  SOLN
100.0000 mL | Freq: Once | INTRAMUSCULAR | Status: AC | PRN
  Administered 2020-04-15: 100 mL via INTRAVENOUS
  Filled 2020-04-15: qty 100

## 2020-04-15 MED ORDER — ONDANSETRON HCL 4 MG/2ML IJ SOLN
4.0000 mg | Freq: Once | INTRAMUSCULAR | Status: AC
  Administered 2020-04-15: 4 mg via INTRAVENOUS
  Filled 2020-04-15: qty 2

## 2020-04-15 MED ORDER — MORPHINE SULFATE (PF) 4 MG/ML IV SOLN
4.0000 mg | Freq: Once | INTRAVENOUS | Status: AC
  Administered 2020-04-15: 4 mg via INTRAVENOUS
  Filled 2020-04-15: qty 1

## 2020-04-15 MED ORDER — KETOROLAC TROMETHAMINE 30 MG/ML IJ SOLN
15.0000 mg | Freq: Once | INTRAMUSCULAR | Status: AC
  Administered 2020-04-15: 15 mg via INTRAVENOUS
  Filled 2020-04-15: qty 1

## 2020-04-15 MED ORDER — HYDROMORPHONE HCL 1 MG/ML IJ SOLN
1.0000 mg | Freq: Once | INTRAMUSCULAR | Status: AC
  Administered 2020-04-15: 1 mg via INTRAVENOUS
  Filled 2020-04-15: qty 1

## 2020-04-15 MED ORDER — LACTATED RINGERS IV BOLUS
1000.0000 mL | Freq: Once | INTRAVENOUS | Status: AC
  Administered 2020-04-15: 1000 mL via INTRAVENOUS

## 2020-04-15 MED ORDER — DOXYCYCLINE HYCLATE 100 MG PO TABS
100.0000 mg | ORAL_TABLET | Freq: Once | ORAL | Status: AC
  Administered 2020-04-15: 100 mg via ORAL
  Filled 2020-04-15: qty 1

## 2020-04-15 MED ORDER — SODIUM CHLORIDE 0.9 % IV SOLN
2.0000 g | Freq: Once | INTRAVENOUS | Status: AC
  Administered 2020-04-15: 2 g via INTRAVENOUS
  Filled 2020-04-15: qty 20

## 2020-04-15 NOTE — Discharge Instructions (Signed)
Take the full course of antibiotic  Take ibuprofen/advil over-the-counter 600 mg every 8 hours for moderate pain  Avoid sexual intercourse until the treatment is complete

## 2020-04-15 NOTE — ED Provider Notes (Signed)
[facility] Emergency Department Provider Note  ____________________________________________   First MD Initiated Contact with Patient 04/15/20 1234     (approximate)  I have reviewed the triage vital signs and the nursing notes.   HISTORY  Chief Complaint Abdominal Pain    HPI Elizabeth Marks is a 39 y.o. female  Here with abdominal pain starting Tue/Wed last week. Pt states that her symptoms started towards Tuesday Wednesday of last week as mild, diffuse, but primarily lower abdominal pain.  Since then, she has had persistent abdominal pain, nausea, and occasional right upper quadrant pain.  She has been vomiting.  She went to her PCP and had a KUB which was unremarkable.  She was given symptomatic meds without significant relief and she has had progressively worsening pain and weakness since then.  She has that she has some mild vaginal discharge that she denies any hypersexual activity.  She had some moderate urinary frequency as well.  No overt flank pain.  No fevers or chills.  No weight loss or night sweats.  No recent sick contacts.  The pain is aching, gnawing, generalized, without specific alleviating or aggravating factors other than worsening pain with eating.       Past Medical History:  Diagnosis Date  . Allergy   . Anxiety 04/07/2019    Patient Active Problem List   Diagnosis Date Noted  . Hydradenitis 08/08/2019  . Abdominal pain 07/28/2019  . 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 Galactorrhea 05/18/2019  . Anxiety 04/07/2019  . Bloating 04/07/2019    Past Surgical History:  Procedure Laterality Date  . CESAREAN SECTION    . INTRAUTERINE DEVICE (IUD) INSERTION  08/25/2019   Procedure: INTRAUTERINE DEVICE (IUD) INSERTION;  Surgeon: Harlin Heys, MD;  Location: [facility] ORS;  Service: Gynecology;;  . IUD REMOVAL  08/25/2019    Procedure: INTRAUTERINE DEVICE (IUD) REMOVAL;  Surgeon: Harlin Heys, MD;  Location: [facility] ORS;  Service: Gynecology;;  . LEEP N/A 08/25/2019   Procedure: LOOP ELECTROSURGICAL EXCISION PROCEDURE (LEEP);  Surgeon: Harlin Heys, MD;  Location: [facility] ORS;  Service: Gynecology;  Laterality: N/A;  . TONSILLECTOMY AND ADENOIDECTOMY    . WISDOM TOOTH EXTRACTION      Prior to Admission medications   Medication Sig Start Date End Date Taking? Authorizing Provider  clindamycin (CLEOCIN T) 1 % lotion Apply 1 application topically daily.    [provider]  doxycycline (VIBRAMYCIN) 100 MG capsule Take 1 capsule (100 mg total) by mouth 2 (two) times daily for 14 days. 04/15/20 04/29/20  Duffy Bruce, MD  escitalopram (LEXAPRO) 20 MG tablet Take 1 tablet (20 mg total) by mouth daily. 02/28/20   Trinna Post, PA-C  fluticasone (FLONASE) 50 MCG/ACT nasal spray Place into the nose. 12/17/19 12/16/20  [provider]  HYDROcodone-acetaminophen (NORCO/VICODIN) 5-325 MG tablet Take 1 tablet by mouth every 6 (six) hours as needed for moderate pain or severe pain. 04/15/20 04/15/21  Duffy Bruce, MD  levonorgestrel (MIRENA) 20 MCG/24HR IUD 1 each by Intrauterine route once.    [provider]  metroNIDAZOLE (FLAGYL) 500 MG tablet Take 1 tablet (500 mg total) by mouth 2 (two) times daily for 14 days. 04/15/20 04/29/20  Duffy Bruce, MD  ondansetron (ZOFRAN ODT) 4 MG disintegrating tablet Take 1 tablet (4 mg total) by mouth every 8 (eight) hours as needed for nausea or  vomiting. 04/15/20   Duffy Bruce, MD  ondansetron (ZOFRAN) 4 MG tablet Take 1 tablet (4 mg total) by mouth every 8 (eight) hours as needed. 04/12/20   Mar Daring, PA-C  sertraline (ZOLOFT) 100 MG tablet Take 1 tablet (100 mg total) by mouth daily. Patient not taking: Reported on 04/12/2020 11/15/19   Trinna Post, PA-C    Allergies Corn-containing products, Shellfish allergy, Adhesive [tape], and  Latex  Family History  Problem Relation Age of Onset  . Diabetes Mother   . Fibroids Mother   . Cancer Father   . Cancer Sister        uterus  . Anxiety disorder Son   . Kidney disease Maternal Aunt   . Aneurysm Maternal Aunt   . Breast cancer Neg Hx   . Ovarian cancer Neg Hx   . Colon cancer Neg Hx     Social History Social History   Tobacco Use  . Smoking status: Current Every Day Smoker    Packs/day: 0.25    Types: Cigarettes  . Smokeless tobacco: Never Used  Vaping Use  . Vaping Use: Never used  Substance Use Topics  . Alcohol use: Yes    Alcohol/week: 1.0 standard drink    Types: 1 Standard drinks or equivalent per week  . Drug use: Yes    Types: Other-see comments, Marijuana    Comment: CBD    Review of Systems  Review of Systems  Constitutional: Positive for fatigue. Negative for fever.  HENT: Negative for congestion and sore throat.   Eyes: Negative for visual disturbance.  Respiratory: Negative for cough and shortness of breath.   Cardiovascular: Negative for chest pain.  Gastrointestinal: Positive for abdominal pain and nausea. Negative for diarrhea and vomiting.  Genitourinary: Positive for dysuria and vaginal discharge. Negative for flank pain.  Musculoskeletal: Negative for back pain and neck pain.  Skin: Negative for rash and wound.  Neurological: Negative for weakness.  All other systems reviewed and are negative.    ____________________________________________  PHYSICAL EXAM:      VITAL SIGNS: ED Triage Vitals  Enc Vitals Group     BP 04/15/20 1054 115/88     Pulse Rate 04/15/20 1054 67     Resp 04/15/20 1054 16     Temp 04/15/20 1054 98.2 F (36.8 C)     Temp Source 04/15/20 1054 Oral     SpO2 04/15/20 1054 100 %     Weight 04/15/20 1055 183 lb (83 kg)     Height 04/15/20 1055 5\' 2"  (1.575 m)     Head Circumference --      Peak Flow --      Pain Score 04/15/20 1055 8     Pain Loc --      Pain Edu? --      Excl. in [place]? --       Physical Exam Vitals and nursing note reviewed.  Constitutional:      General: She is not in acute distress.    Appearance: She is well-developed.  HENT:     Head: Normocephalic and atraumatic.  Eyes:     Conjunctiva/sclera: Conjunctivae normal.  Cardiovascular:     Rate and Rhythm: Normal rate and regular rhythm.     Heart sounds: Normal heart sounds. No murmur heard.  No friction rub.  Pulmonary:     Effort: Pulmonary effort is normal. No respiratory distress.     Breath sounds: Normal breath sounds. No wheezing or rales.  Abdominal:  General: There is no distension.     Palpations: Abdomen is soft.     Tenderness: There is abdominal tenderness in the right upper quadrant, right lower quadrant, periumbilical area, suprapubic area and left lower quadrant.     Comments: Moderate diffuse abdominal tenderness, particularly on the right upper and lower quadrants bilaterally.  No rebound or guarding.  Genitourinary:    Comments: Declined Musculoskeletal:     Cervical back: Neck supple.  Skin:    General: Skin is warm.     Capillary Refill: Capillary refill takes less than 2 seconds.  Neurological:     Mental Status: She is alert and oriented to person, place, and time.     Motor: No abnormal muscle tone.       ____________________________________________   LABS (all labs ordered are listed, but only abnormal results are displayed)  Labs Reviewed  COMPREHENSIVE METABOLIC PANEL - Abnormal; Notable for the following components:      Result Value   Glucose, Bld 104 (*)    All other components within normal limits  CBC - Abnormal; Notable for the following components:   WBC 11.2 (*)    All other components within normal limits  URINALYSIS, COMPLETE (UACMP) WITH MICROSCOPIC - Abnormal; Notable for the following components:   Color, Urine AMBER (*)    APPearance CLOUDY (*)    Specific Gravity, Urine 1.031 (*)    Hgb urine dipstick SMALL (*)    Ketones, ur 5 (*)     Protein, ur 100 (*)    Leukocytes,Ua LARGE (*)    RBC / HPF >50 (*)    WBC, UA >50 (*)    Squamous Epithelial / LPF >50 (*)    All other components within normal limits  URINE CULTURE  CHLAMYDIA/NGC RT PCR ([facility] ONLY)  WET PREP, GENITAL  LIPASE, BLOOD  POC URINE PREG, ED  POCT PREGNANCY, URINE    ____________________________________________  EKG:  ________________________________________  RADIOLOGY All imaging, including plain films, CT scans, and ultrasounds, independently reviewed by me, and interpretations confirmed via formal radiology reads.  ED MD interpretation:   CT abdomen/pelvis: Nonspecific infectious turn inflammatory enteritis, elongated tubular densities in the bilateral adnexa, correlate with PID, likely hemangioma in the liver U/S: Prominent elongated bilateral adnexal fallopian tubes without abscess, unremarkable ovaries with good flow  Official radiology report(s): CT ABDOMEN PELVIS W CONTRAST  Result Date: 04/15/2020 CLINICAL DATA:  Abdominal distension and abdominal pain EXAM: CT ABDOMEN AND PELVIS WITH CONTRAST TECHNIQUE: Multidetector CT imaging of the abdomen and pelvis was performed using the standard protocol following bolus administration of intravenous contrast. CONTRAST:  19mL OMNIPAQUE IOHEXOL 300 MG/ML  SOLN COMPARISON:  Abdominal radiograph 04/12/2020 FINDINGS: Lower chest: No acute abnormality. Hepatobiliary: Multilobulated low-density lesion within the right hepatic lobe measuring approximately 9.0 x 6.0 x 8.0 cm (series 2, image 34; series 5, image 37) with peripheral, nodular, discontinuous enhancement. Remainder of the liver has an unremarkable appearance. Prior cholecystectomy. No biliary dilatation. Pancreas: Unremarkable. No pancreatic ductal dilatation or surrounding inflammatory changes. Spleen: Normal in size without focal abnormality. Adrenals/Urinary Tract: Unremarkable adrenal glands. Kidneys enhance symmetrically without focal  lesion, stone, or hydronephrosis. Ureters are nondilated. Urinary bladder appears unremarkable. Stomach/Bowel: Stomach is within normal limits. Appendix appears normal (series 2, image 58). There are numerous loops of small bowel throughout the abdomen with submucosal edema (for example series 5, image 46). Additional fluid-filled small bowel loops. No dilated loops of bowel to suggest obstruction. No focal colonic wall thickening or  pericolonic inflammatory changes. Vascular/Lymphatic: No significant vascular findings are present. No enlarged abdominal or pelvic lymph nodes. Reproductive: Unremarkable appearing uterus with IUD. Elongated tubular densities within the bilateral adnexa with adjacent free fluid (series 2, images 68-69). Other: Small volume free fluid within the pelvis. No pneumoperitoneum. No abdominal wall hernia. Musculoskeletal: No acute or significant osseous findings. IMPRESSION: 1. Numerous loops of small bowel throughout the abdomen with submucosal edema. Additional fluid-filled small bowel loops. Findings are suggestive of a nonspecific infectious or inflammatory enteritis. No evidence of bowel obstruction. 2. Elongated tubular densities within the bilateral adnexa with adjacent free fluid. Further evaluation with pelvic ultrasound is recommended to evaluate for pelvic inflammatory disease. 3. Large multilobulated low-density lesion within the right hepatic lobe measuring up to 9.0 cm with peripheral, nodular, discontinuous enhancement, most suggestive of a hemangioma. Definitive characterization with a nonemergent contrast-enhanced hepatic protocol MRI of the abdomen is recommended. Electronically Signed   By: Davina Poke D.O.   On: 04/15/2020 13:49   US PELVIC COMPLETE W TRANSVAGINAL AND TORSION R/O  Result Date: 04/15/2020 CLINICAL DATA:  Chronic pelvic pain.  Abnormal CT EXAM: TRANSABDOMINAL AND TRANSVAGINAL ULTRASOUND OF PELVIS DOPPLER ULTRASOUND OF OVARIES TECHNIQUE: Both  transabdominal and transvaginal ultrasound examinations of the pelvis were performed. Transabdominal technique was performed for global imaging of the pelvis including uterus, ovaries, adnexal regions, and pelvic cul-de-sac. It was necessary to proceed with endovaginal exam following the transabdominal exam to visualize the adnexa. Color and duplex Doppler ultrasound was utilized to evaluate blood flow to the ovaries. COMPARISON:  CT 04/15/2020 FINDINGS: Uterus Measurements: 8.4 x 3.7 x 5.7 cm = volume: 107 mL. No fibroids or other mass visualized. Endometrium Thickness: 2 mm.  IUD is present within the endometrial canal. Right ovary Measurements: 2.9 x 1.9 x 1.6 cm = volume: 5 mL. Normal appearance of the right ovary. Left ovary Measurements: 3.4 x 1.7 x 2.7 cm = volume: 8 mL. Normal appearance of the left ovary. Pulsed Doppler evaluation of both ovaries demonstrates normal low-resistance arterial and venous waveforms. Other findings Elongated tubular structures within the bilateral adnexal regions are present without well-defined fluid collection. Small volume free fluid within the cul-de-sac. IMPRESSION: 1. Prominent elongated tubular structures in the bilateral adnexal regions are favored to represent prominent fallopian tubes findings may reflect sequela of pelvic inflammatory disease. No fluid collection or evidence of acute inflammation to suggest tubo-ovarian abscess. 2. Unremarkable appearance of the ovaries without evidence of adnexal torsion. Electronically Signed   By: Davina Poke D.O.   On: 04/15/2020 16:44   US PELVIS TRANSVAGINAL NON OB(TV&3D)  Result Date: 04/15/2020 CLINICAL DATA:  Chronic pelvic pain.  Abnormal CT EXAM: TRANSABDOMINAL AND TRANSVAGINAL ULTRASOUND OF PELVIS DOPPLER ULTRASOUND OF OVARIES TECHNIQUE: Both transabdominal and transvaginal ultrasound examinations of the pelvis were performed. Transabdominal technique was performed for global imaging of the pelvis including uterus,  ovaries, adnexal regions, and pelvic cul-de-sac. It was necessary to proceed with endovaginal exam following the transabdominal exam to visualize the adnexa. Color and duplex Doppler ultrasound was utilized to evaluate blood flow to the ovaries. COMPARISON:  CT 04/15/2020 FINDINGS: Uterus Measurements: 8.4 x 3.7 x 5.7 cm = volume: 107 mL. No fibroids or other mass visualized. Endometrium Thickness: 2 mm.  IUD is present within the endometrial canal. Right ovary Measurements: 2.9 x 1.9 x 1.6 cm = volume: 5 mL. Normal appearance of the right ovary. Left ovary Measurements: 3.4 x 1.7 x 2.7 cm = volume: 8 mL. Normal appearance of the  left ovary. Pulsed Doppler evaluation of both ovaries demonstrates normal low-resistance arterial and venous waveforms. Other findings Elongated tubular structures within the bilateral adnexal regions are present without well-defined fluid collection. Small volume free fluid within the cul-de-sac. IMPRESSION: 1. Prominent elongated tubular structures in the bilateral adnexal regions are favored to represent prominent fallopian tubes findings may reflect sequela of pelvic inflammatory disease. No fluid collection or evidence of acute inflammation to suggest tubo-ovarian abscess. 2. Unremarkable appearance of the ovaries without evidence of adnexal torsion. Electronically Signed   By: Davina Poke D.O.   On: 04/15/2020 16:44    ____________________________________________  PROCEDURES   Procedure(s) performed (including Critical Care):  Procedures  ____________________________________________  INITIAL IMPRESSION / MDM / [place] / ED COURSE  As part of my medical decision making, I reviewed the following data within the [place] notes reviewed and incorporated, Old chart reviewed, Notes from prior ED visits, and [place] Controlled Substance Database       *TERRY ABILA was evaluated in Emergency Department on 04/15/2020 for the  symptoms described in the history of present illness. She was evaluated in the context of the global COVID-19 pandemic, which necessitated consideration that the patient might be at risk for infection with the SARS-CoV-2 virus that causes COVID-19. Institutional protocols and algorithms that pertain to the evaluation of patients at risk for COVID-19 are in a state of rapid change based on information released by regulatory bodies including the CDC and federal and state organizations. These policies and algorithms were followed during the patient's care in the ED.  Some ED evaluations and interventions may be delayed as a result of limited staffing during the pandemic.*  Clinical Course as of Apr 15 1800  Mon Apr 15, 2020  1322 39 yo F here with abd pain, n/v. DDx includes SBO, gastroparesis, IBS, colitis/diverticulitis, also UTI though duration is somewhat abnormal, but UA does show pyuria (though ? Dirty sample). Mild leukocytosis noted. CT pending.   [CI]    Clinical Course User Index [CI] Duffy Bruce, MD    Medical Decision Making: As above.  CT and ultrasound is consistent with likely acute PID.  Patient may also have a component of enteritis.  No evidence of appendicitis or acute surgical abnormality.  The patient feels markedly improved with fluids.  She has been given a large dose of Rocephin here, fluids, and is tolerating p.o.  Offered pelvic and wet prep/GC testing, but given that we are going to cover her empirically, patient declines which I think is reasonable, particularly in the setting of reassuring ultrasound.  Will treat as an outpatient with combined doxycycline and Flagyl, discharge with outpatient follow-up.  ____________________________________________  FINAL CLINICAL IMPRESSION(S) / ED DIAGNOSES  Final diagnoses:  PID (acute pelvic inflammatory disease)     MEDICATIONS GIVEN DURING THIS VISIT:  Medications  lactated ringers bolus 1,000 mL (0 mLs Intravenous Stopped  04/15/20 1608)  ondansetron (ZOFRAN) injection 4 mg (4 mg Intravenous Given 04/15/20 1319)  HYDROmorphone (DILAUDID) injection 1 mg (1 mg Intravenous Given 04/15/20 1319)  ketorolac (TORADOL) 30 MG/ML injection 15 mg (15 mg Intravenous Given 04/15/20 1319)  iohexol (OMNIPAQUE) 300 MG/ML solution 100 mL (100 mLs Intravenous Contrast Given 04/15/20 1328)  cefTRIAXone (ROCEPHIN) 2 g in sodium chloride 0.9 % 100 mL IVPB (0 g Intravenous Stopped 04/15/20 1608)  doxycycline (VIBRA-TABS) tablet 100 mg (100 mg Oral Given 04/15/20 1408)  morphine 4 MG/ML injection 4 mg (4 mg Intravenous Given 04/15/20  1601)     ED Discharge Orders         Ordered    doxycycline (VIBRAMYCIN) 100 MG capsule  2 times daily     Discontinue  Reprint     04/15/20 1651    ondansetron (ZOFRAN ODT) 4 MG disintegrating tablet  Every 8 hours PRN     Discontinue  Reprint     04/15/20 1651    HYDROcodone-acetaminophen (NORCO/VICODIN) 5-325 MG tablet  Every 6 hours PRN     Discontinue  Reprint     04/15/20 1651    metroNIDAZOLE (FLAGYL) 500 MG tablet  2 times daily     Discontinue  Reprint     04/15/20 1651           Note:  This document was prepared using Dragon voice recognition software and may include unintentional dictation errors.   Duffy Bruce, MD 04/15/20 1801

## 2020-04-15 NOTE — Telephone Encounter (Signed)
-----   Message from Mar Daring, PA-C sent at 04/15/2020 10:47 AM EDT ----- Odette Horns of abdomen was normal. How are you feeling? Did you try the trulance?

## 2020-04-15 NOTE — ED Notes (Signed)
Pt tolerated PO fluids well. MD Isaacs aware.

## 2020-04-15 NOTE — ED Triage Notes (Addendum)
Pt here for abd pain that started this past Wed. Pt has been nauseous and has thrown up 3 times today. Pt had gallbladder removed, last BM this past Tuesday. Was seen last Friday with scans done.

## 2020-04-15 NOTE — Telephone Encounter (Signed)
Pt called saying she was going to the ER.  She does not feel any better from last week and is still throwing up.

## 2020-04-15 NOTE — Telephone Encounter (Signed)
Patient is at hospital now.

## 2020-04-15 NOTE — Telephone Encounter (Signed)
Copied from [place] [id number]. Topic: General - Other >> Apr 15, 2020  9:24 AM Rainey Pines A wrote: Patient is wanting a nurse to call her back to go over xray results

## 2020-04-15 NOTE — Telephone Encounter (Signed)
Patient advised as directed below. Per patient she is not any better and she is currently at the ED.

## 2020-04-15 NOTE — ED Notes (Signed)
E-signature not working at this time. Pt verbalized understanding of D/C instructions, prescriptions and follow up care with no further questions at this time. Pt in NAD and ambulatory at time of D/C.  

## 2020-04-15 NOTE — Telephone Encounter (Signed)
Noted. I am sorry to hear that.  

## 2020-04-16 ENCOUNTER — Telehealth: Payer: Self-pay | Admitting: Emergency Medicine

## 2020-04-16 LAB — URINE CULTURE: Culture: NO GROWTH

## 2020-04-16 NOTE — Telephone Encounter (Signed)
Called patient and informed of positive gonorrhea result.  Explained that she needs to continuet he doxycycline.  Also  Asked her to inform partner and that free treatment is available at [facility].

## 2020-04-18 ENCOUNTER — Telehealth: Payer: Self-pay

## 2020-04-18 NOTE — Telephone Encounter (Signed)
cefTRIAXone (ROCEPHIN) 2 g in sodium chloride 0.9 % 100 mL IVPB (0 g Intravenous Stopped 04/15/20 1608)  She was given 2 grams of rocephin in the ER two days ago this should cover gonorrhea. Chlamydia was negative. Jiles Garter reports patient hs not taken Flagyl today that Doxy is making her sick. Ok to discontinue if she is taking as instructed, don't lay down within an hour of taking it, take with food. No alcohol.   She should return to the office if symptoms persist at anytime and she should return for a test of cure given her symptoms she had in around two weeks.

## 2020-04-18 NOTE — Telephone Encounter (Signed)
Patient calling back requesting to speak with CMA to discuss medications prescribed for UTI and STD. She states she is unsure which medications are used to treat either of them but that she is still having same symptoms she was initially seen for. She states that she is also having issues keeping medication down and would like to speak with clinical staff as soon as possible.

## 2020-04-18 NOTE — Telephone Encounter (Signed)
Patient called complaining of being sick, vomiting several times.  She insists the medications are making her sick and she has to have something different.  After reviewing all of her results, medications given through the ER and discussing with Laverna Peace, patient has been advised that she can discontinue Flagyl and Doxy.  She has been treated appropriately for the [facility] which is the only thing that came back positive.  She was advised that if she was still having symptoms on Monday to call and plan on being seen.

## 2020-04-18 NOTE — Telephone Encounter (Signed)
Just to clarify Gonorrhea was positive. Chlamydia was negative in urine.  Recommend cervical swab and retesting if symptoms were persistent.

## 2020-04-18 NOTE — Telephone Encounter (Signed)
Patient was advised of this

## 2020-04-18 NOTE — Telephone Encounter (Signed)
Pt called back saying the medication that was given in the ED is not helping her.  It is making her sick on her stomach and she is confused as to wehter is is what she needs tobe taking for the UTI.  CB#  [phone]

## 2020-04-18 NOTE — Telephone Encounter (Addendum)
Please advise. Patient was seen in the ER on 04/15/2020 for PID. She was given a Rocephin injection and prescribed Flagyl and Doxy. Patient is calling stating the medication is making her sick to the stomach and she is still having initial symptoms that brought her to the ER. It looks like her urine culture was negative for infection, and Gonorrhea was positive.

## 2020-04-18 NOTE — Telephone Encounter (Signed)
Copied from [place] [id number]. Topic: General - Other >> Apr 18, 2020  8:13 AM Hinda Lenis D wrote: Reason for CRM: PT has a question about her medicine / please advise

## 2020-04-29 ENCOUNTER — Other Ambulatory Visit (HOSPITAL_COMMUNITY)
Admission: RE | Admit: 2020-04-29 | Discharge: 2020-04-29 | Disposition: A | Discharge status: Home / Self Care | Payer: 59 | Source: Ambulatory Visit | Attending: Physician Assistant | Admitting: Physician Assistant

## 2020-04-29 ENCOUNTER — Other Ambulatory Visit: Payer: Self-pay

## 2020-04-29 ENCOUNTER — Ambulatory Visit: Payer: 59 | Admitting: Physician Assistant

## 2020-04-29 ENCOUNTER — Encounter: Payer: Self-pay | Admitting: Physician Assistant

## 2020-04-29 VITALS — BP 118/68 | HR 66 | Temp 96.6°F | Resp 16 | Wt 186.8 lb

## 2020-04-29 DIAGNOSIS — L239 Allergic contact dermatitis, unspecified cause: Secondary | ICD-10-CM

## 2020-04-29 DIAGNOSIS — Z202 Contact with and (suspected) exposure to infections with a predominantly sexual mode of transmission: Secondary | ICD-10-CM

## 2020-04-29 DIAGNOSIS — F419 Anxiety disorder, unspecified: Secondary | ICD-10-CM

## 2020-04-29 DIAGNOSIS — R160 Hepatomegaly, not elsewhere classified: Secondary | ICD-10-CM

## 2020-04-29 DIAGNOSIS — K5904 Chronic idiopathic constipation: Secondary | ICD-10-CM | POA: Diagnosis not present

## 2020-04-29 MED ORDER — TRULANCE 3 MG PO TABS
3.0000 mg | ORAL_TABLET | Freq: Every day | ORAL | 0 refills | Status: DC
Start: 2020-04-29 — End: 2021-01-30

## 2020-04-29 MED ORDER — ESCITALOPRAM OXALATE 20 MG PO TABS: 20.0000 mg | ORAL_TABLET | Freq: Every day | ORAL | 3 refills | Status: AC

## 2020-04-29 MED ORDER — TRIAMCINOLONE ACETONIDE 0.1 % EX CREA: 1.0000 "application " | TOPICAL_CREAM | Freq: Two times a day (BID) | CUTANEOUS | 0 refills | Status: AC

## 2020-04-29 MED ORDER — CLINDAMYCIN PHOSPHATE 1 % EX LOTN: 1.0000 "application " | TOPICAL_LOTION | Freq: Every day | CUTANEOUS | 0 refills | Status: AC

## 2020-04-29 NOTE — Patient Instructions (Addendum)
Managing Anxiety, Adult After being diagnosed with an anxiety disorder, you may be relieved to know why you have felt or behaved a certain way. You may also feel overwhelmed about the treatment ahead and what it will mean for your life. With care and support, you can manage this condition and recover from it. How to manage lifestyle changes Managing stress and anxiety  Stress is your body's reaction to life changes and events, both good and bad. Most stress will last just a few hours, but stress can be ongoing and can lead to more than just stress. Although stress can play a major role in anxiety, it is not the same as anxiety. Stress is usually caused by something external, such as a deadline, test, or competition. Stress normally passes after the triggering event has ended.  Anxiety is caused by something internal, such as imagining a terrible outcome or worrying that something will go wrong that will devastate you. Anxiety often does not go away even after the triggering event is over, and it can become long-term (chronic) worry. It is important to understand the differences between stress and anxiety and to manage your stress effectively so that it does not lead to an anxious response. Talk with your health care provider or a counselor to learn more about reducing anxiety and stress. He or she may suggest tension reduction techniques, such as:  Music therapy. This can include creating or listening to music that you enjoy and that inspires you.  Mindfulness-based meditation. This involves being aware of your normal breaths while not trying to control your breathing. It can be done while sitting or walking.  Centering prayer. This involves focusing on a word, phrase, or sacred image that means something to you and brings you peace.  Deep breathing. To do this, expand your stomach and inhale slowly through your nose. Hold your breath for 3-5 seconds. Then exhale slowly, letting your stomach muscles  relax.  Self-talk. This involves identifying thought patterns that lead to anxiety reactions and changing those patterns.  Muscle relaxation. This involves tensing muscles and then relaxing them. Choose a tension reduction technique that suits your lifestyle and personality. These techniques take time and practice. Set aside 5-15 minutes a day to do them. Therapists can offer counseling and training in these techniques. The training to help with anxiety may be covered by some insurance plans. Other things you can do to manage stress and anxiety include:  Keeping a stress/anxiety diary. This can help you learn what triggers your reaction and then learn ways to manage your response.  Thinking about how you react to certain situations. You may not be able to control everything, but you can control your response.  Making time for activities that help you relax and not feeling guilty about spending your time in this way.  Visual imagery and yoga can help you stay calm and relax.  Medicines Medicines can help ease symptoms. Medicines for anxiety include:  Anti-anxiety drugs.  Antidepressants. Medicines are often used as a primary treatment for anxiety disorder. Medicines will be prescribed by a health care provider. When used together, medicines, psychotherapy, and tension reduction techniques may be the most effective treatment. Relationships Relationships can play a big part in helping you recover. Try to spend more time connecting with trusted friends and family members. Consider going to couples counseling, taking family education classes, or going to family therapy. Therapy can help you and others better understand your condition. How to recognize changes in your   anxiety Everyone responds differently to treatment for anxiety. Recovery from anxiety happens when symptoms decrease and stop interfering with your daily activities at home or work. This may mean that you will start to:  Have  better concentration and focus. Worry will interfere less in your daily thinking.  Sleep better.  Be less irritable.  Have more energy.  Have improved memory. It is important to recognize when your condition is getting worse. Contact your health care provider if your symptoms interfere with home or work and you feel like your condition is not improving. Follow these instructions at home: Activity  Exercise. Most adults should do the following: ? Exercise for at least 150 minutes each week. The exercise should increase your heart rate and make you sweat (moderate-intensity exercise). ? Strengthening exercises at least twice a week.  Get the right amount and quality of sleep. Most adults need 7-9 hours of sleep each night. Lifestyle   Eat a healthy diet that includes plenty of vegetables, fruits, whole grains, low-fat dairy products, and lean protein. Do not eat a lot of foods that are high in solid fats, added sugars, or salt.  Make choices that simplify your life.  Do not use any products that contain nicotine or tobacco, such as cigarettes, e-cigarettes, and chewing tobacco. If you need help quitting, ask your health care provider.  Avoid caffeine, alcohol, and certain over-the-counter cold medicines. These may make you feel worse. Ask your pharmacist which medicines to avoid. General instructions  Take over-the-counter and prescription medicines only as told by your health care provider.  Keep all follow-up visits as told by your health care provider. This is important. Where to find support You can get help and support from these sources:  Self-help groups.  Online and community organizations.  A trusted spiritual leader.  Couples counseling.  Family education classes.  Family therapy. Where to find more information You may find that joining a support group helps you deal with your anxiety. The following sources can help you locate counselors or support groups near  you:  Mental Health America: www.mentalhealthamerica.net  Anxiety and Depression Association of America (ADAA): www.adaa.org  National Alliance on Mental Illness (NAMI): www.nami.org Contact a health care provider if you:  Have a hard time staying focused or finishing daily tasks.  Spend many hours a day feeling worried about everyday life.  Become exhausted by worry.  Start to have headaches, feel tense, or have nausea.  Urinate more than normal.  Have diarrhea. Get help right away if you have:  A racing heart and shortness of breath.  Thoughts of hurting yourself or others. If you ever feel like you may hurt yourself or others, or have thoughts about taking your own life, get help right away. You can go to your nearest emergency department or call:  Your local emergency services (911 in the [place]  A suicide crisis helpline, such as the National Suicide Prevention Lifeline at 1-800-273-8255. This is open 24 hours a day. Summary  Taking steps to learn and use tension reduction techniques can help calm you and help prevent triggering an anxiety reaction.  When used together, medicines, psychotherapy, and tension reduction techniques may be the most effective treatment.  Family, friends, and partners can play a big part in helping you recover from an anxiety disorder. This information is not intended to replace advice given to you by your health care provider. Make sure you discuss any questions you have with your health care provider. Document Revised:   03/21/2019 Document Reviewed: 03/21/2019 Elsevier Patient Education  [place].    Constipation, Adult Constipation is when a person:  Poops (has a bowel movement) fewer times in a week than normal.  Has a hard time pooping.  Has poop that is dry, hard, or bigger than normal. Follow these instructions at home: Eating and drinking   Eat foods that have a lot of fiber, such as: ? Fresh fruits and  vegetables. ? Whole grains. ? Beans.  Eat less of foods that are high in fat, low in fiber, or overly processed, such as: ? [place] fries. ? Hamburgers. ? Cookies. ? Candy. ? Soda.  Drink enough fluid to keep your pee (urine) clear or pale yellow. General instructions  Exercise regularly or as told by your doctor.  Go to the restroom when you feel like you need to poop. Do not hold it in.  Take over-the-counter and prescription medicines only as told by your doctor. These include any fiber supplements.  Do pelvic floor retraining exercises, such as: ? Doing deep breathing while relaxing your lower belly (abdomen). ? Relaxing your pelvic floor while pooping.  Watch your condition for any changes.  Keep all follow-up visits as told by your doctor. This is important. Contact a doctor if:  You have pain that gets worse.  You have a fever.  You have not pooped for 4 days.  You throw up (vomit).  You are not hungry.  You lose weight.  You are bleeding from the anus.  You have thin, pencil-like poop (stool). Get help right away if:  You have a fever, and your symptoms suddenly get worse.  You leak poop or have blood in your poop.  Your belly feels hard or bigger than normal (is bloated).  You have very bad belly pain.  You feel dizzy or you faint. This information is not intended to replace advice given to you by your health care provider. Make sure you discuss any questions you have with your health care provider. Document Revised: 10/01/2017 Document Reviewed: 04/08/2016 Elsevier Patient Education  [place].  High-Fiber Diet Fiber, also called dietary fiber, is a type of carbohydrate that is found in fruits, vegetables, whole grains, and beans. A high-fiber diet can have many health benefits. Your health care provider may recommend a high-fiber diet to help:  Prevent constipation. Fiber can make your bowel movements more regular.  Lower your  cholesterol.  Relieve the following conditions: ? Swelling of veins in the anus (hemorrhoids). ? Swelling and irritation (inflammation) of specific areas of the digestive tract (uncomplicated diverticulosis). ? A problem of the large intestine (colon) that sometimes causes pain and diarrhea (irritable bowel syndrome, IBS).  Prevent overeating as part of a weight-loss plan.  Prevent heart disease, type 2 diabetes, and certain cancers. What is my plan? The recommended daily fiber intake in grams (g) includes:  38 g for men age 64 or younger.  30 g for men over age 31.  12 g for women age 47 or younger.  21 g for women over age 28. You can get the recommended daily intake of dietary fiber by:  Eating a variety of fruits, vegetables, grains, and beans.  Taking a fiber supplement, if it is not possible to get enough fiber through your diet. What do I need to know about a high-fiber diet?  It is better to get fiber through food sources rather than from fiber supplements. There is not a lot of research about  how effective supplements are.  Always check the fiber content on the nutrition facts label of any prepackaged food. Look for foods that contain 5 g of fiber or more per serving.  Talk with a diet and nutrition specialist (dietitian) if you have questions about specific foods that are recommended or not recommended for your medical condition, especially if those foods are not listed below.  Gradually increase how much fiber you consume. If you increase your intake of dietary fiber too quickly, you may have bloating, cramping, or gas.  Drink plenty of water. Water helps you to digest fiber. What are tips for following this plan?  Eat a wide variety of high-fiber foods.  Make sure that half of the grains that you eat each day are whole grains.  Eat breads and cereals that are made with whole-grain flour instead of refined flour or white flour.  Eat brown rice, bulgur wheat, or  millet instead of white rice.  Start the day with a breakfast that is high in fiber, such as a cereal that contains 5 g of fiber or more per serving.  Use beans in place of meat in soups, salads, and pasta dishes.  Eat high-fiber snacks, such as berries, raw vegetables, nuts, and popcorn.  Choose whole fruits and vegetables instead of processed forms like juice or sauce. What foods can I eat?  Fruits Berries. Pears. Apples. Oranges. Avocado. Prunes and raisins. Dried figs. Vegetables Sweet potatoes. Spinach. Kale. Artichokes. Cabbage. Broccoli. Cauliflower. Green peas. Carrots. Squash. Grains Whole-grain breads. Multigrain cereal. Oats and oatmeal. Brown rice. Barley. Bulgur wheat. [place]. Quinoa. Bran muffins. Popcorn. Rye wafer crackers. Meats and other proteins Navy, kidney, and pinto beans. Soybeans. Split peas. Lentils. Nuts and seeds. Dairy Fiber-fortified yogurt. Beverages Fiber-fortified soy milk. Fiber-fortified orange juice. Other foods Fiber bars. The items listed above may not be a complete list of recommended foods and beverages. Contact a dietitian for more options. What foods are not recommended? Fruits Fruit juice. Cooked, strained fruit. Vegetables Fried potatoes. Canned vegetables. Well-cooked vegetables. Grains White bread. Pasta made with refined flour. White rice. Meats and other proteins Fatty cuts of meat. Fried chicken or fried fish. Dairy Milk. Yogurt. Cream cheese. Sour cream. Fats and oils Butters. Beverages Soft drinks. Other foods Cakes and pastries. The items listed above may not be a complete list of foods and beverages to avoid. Contact a dietitian for more information. Summary  Fiber is a type of carbohydrate. It is found in fruits, vegetables, whole grains, and beans.  There are many health benefits of eating a high-fiber diet, such as preventing constipation, lowering blood cholesterol, helping with weight loss, and reducing your risk  of heart disease, diabetes, and certain cancers.  Gradually increase your intake of fiber. Increasing too fast can result in cramping, bloating, and gas. Drink plenty of water while you increase your fiber.  The best sources of fiber include whole fruits and vegetables, whole grains, nuts, seeds, and beans. This information is not intended to replace advice given to you by your health care provider. Make sure you discuss any questions you have with your health care provider. Document Revised: 08/23/2017 Document Reviewed: 08/23/2017 Elsevier Patient Education  2020 [place] Dermatitis Dermatitis is redness, soreness, and swelling (inflammation) of the skin. Contact dermatitis is a reaction to something that touches the skin. There are two types of contact dermatitis:  Irritant contact dermatitis. This happens when something bothers (irritates) your skin, like soap.  Allergic contact dermatitis.  This is caused when you are exposed to something that you are allergic to, such as poison ivy. What are the causes?  Common causes of irritant contact dermatitis include: ? Makeup. ? Soaps. ? Detergents. ? Bleaches. ? Acids. ? Metals, such as nickel.  Common causes of allergic contact dermatitis include: ? Plants. ? Chemicals. ? Jewelry. ? Latex. ? Medicines. ? Preservatives in products, such as clothing. What increases the risk?  Having a job that exposes you to things that bother your skin.  Having asthma or eczema. What are the signs or symptoms? Symptoms may happen anywhere the irritant has touched your skin. Symptoms include:  Dry or flaky skin.  Redness.  Cracks.  Itching.  Pain or a burning feeling.  Blisters.  Blood or clear fluid draining from skin cracks. With allergic contact dermatitis, swelling may occur. This may happen in places such as the eyelids, mouth, or genitals. How is this treated?  This condition is treated by checking for the  cause of the reaction and protecting your skin. Treatment may also include: ? Steroid creams, ointments, or medicines. ? Antibiotic medicines or other ointments, if you have a skin infection. ? Lotion or medicines to help with itching. ? A bandage (dressing). Follow these instructions at home: Skin care  Moisturize your skin as needed.  Put cool cloths on your skin.  Put a baking soda paste on your skin. Stir water into baking soda until it looks like a paste.  Do not scratch your skin.  Avoid having things rub up against your skin.  Avoid the use of soaps, perfumes, and dyes. Medicines  Take or apply over-the-counter and prescription medicines only as told by your doctor.  If you were prescribed an antibiotic medicine, take or apply it as told by your doctor. Do not stop using it even if your condition starts to get better. Bathing  Take a bath with: ? Epsom salts. ? Baking soda. ? Colloidal oatmeal.  Bathe less often.  Bathe in warm water. Avoid using hot water. Bandage care  If you were given a bandage, change it as told by your health care provider.  Wash your hands with soap and water before and after you change your bandage. If soap and water are not available, use hand sanitizer. General instructions  Avoid the things that caused your reaction. If you do not know what caused it, keep a journal. Write down: ? What you eat. ? What skin products you use. ? What you drink. ? What you wear in the area that has symptoms. This includes jewelry.  Check the affected areas every day for signs of infection. Check for: ? More redness, swelling, or pain. ? More fluid or blood. ? Warmth. ? Pus or a bad smell.  Keep all follow-up visits as told by your doctor. This is important. Contact a doctor if:  You do not get better with treatment.  Your condition gets worse.  You have signs of infection, such as: ? More swelling. ? Tenderness. ? More  redness. ? Soreness. ? Warmth.  You have a fever.  You have new symptoms. Get help right away if:  You have a very bad headache.  You have neck pain.  Your neck is stiff.  You throw up (vomit).  You feel very sleepy.  You see red streaks coming from the area.  Your bone or joint near the area hurts after the skin has healed.  The area turns darker.  You have trouble  breathing. Summary  Dermatitis is redness, soreness, and swelling of the skin.  Symptoms may occur where the irritant has touched you.  Treatment may include medicines and skin care.  If you do not know what caused your reaction, keep a journal.  Contact a doctor if your condition gets worse or you have signs of infection. This information is not intended to replace advice given to you by your health care provider. Make sure you discuss any questions you have with your health care provider. Document Revised: 02/08/2019 Document Reviewed: 05/04/2018 Elsevier Patient Education  [place].

## 2020-04-29 NOTE — Progress Notes (Signed)
Established patient visit   Patient: Elizabeth Marks   DOB: 21-Dec-1980   39 y.o. Female  MRN: [id number] Visit Date: 04/29/2020  Today's healthcare provider: Trinna Post, PA-C  Nikki Dom Jolana Runkles,acting as a scribe for Trinna Post, PA-C.,have documented all relevant documentation on the behalf of Trinna Post, PA-C,as directed by  Trinna Post, PA-C while in the presence of Trinna Post, PA-C. Chief Complaint  Patient presents with  . Anxiety   Subjective    HPI Anxiety, Follow-up  She was last seen for anxiety 2 months ago. Changes made at last visit include changing zoloft 100 mg daily to  Lexapro 20mg .   She reports excellent compliance with treatment. She reports excellent tolerance of treatment. She is not having side effects.   She feels her anxiety is mild and Improved since last visit. Reports medication is working well and would like to stay on it.   Previously referred to counseling, patient reports interest but not enough time.   Symptoms: No chest pain No difficulty concentrating  No dizziness No fatigue  No feelings of losing control No insomnia  No irritable No palpitations  Yes panic attacks Yes racing thoughts  No shortness of breath No sweating  No tremors/shakes    GAD-7 Results GAD-7 Generalized Anxiety Disorder Screening Tool 04/29/2020 11/15/2019  1. Feeling Nervous, Anxious, or on Edge 0 1  2. Not Being Able to Stop or Control Worrying 0 2  3. Worrying Too Much About Different Things 0 2  4. Trouble Relaxing 1 1  5. Being So Restless it's Hard To Sit Still 0 0  6. Becoming Easily Annoyed or Irritable 1 1  7. Feeling Afraid As If Something Awful Might Happen 0 1  Total GAD-7 Score 2 8  Difficulty At Work, Home, or Getting  Along With Others? Somewhat difficult Somewhat difficult    PHQ-9 Scores PHQ9 SCORE ONLY 04/27/2019 04/07/2019  PHQ-9 Total Score 0 7    Patient was recently seen in the ED for PID. She was  having abdominal pain which worsened and eventually was dx with gonorrhea. Abdominal CT revealed 9 cm liver mass which favors hemangioma. She also had some edema in her small intestine which could represent infectious or inflammatory changes. She was treated with fluids, rocephin, and sent home with doxycycline and flagyl. She did not complete doxycycline or flagyl due to vomiting. Reports tape reaction from the IV.   Reports issues with constipation. This has been chronic and was last addressed in 06/2019. She was instructed to take miralax, metamucil, etc. She reports she can only drink half of the miralax because she doesn't like the taste of it. She was sent home with Linzess but did not take this. Most recently she was seen in this clinic 2 weeks ago and given trulance. She reports this worked for her.  Patient Active Problem List   Diagnosis Date Noted  . Hydradenitis 08/08/2019  . Abdominal pain 07/28/2019  . 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 Galactorrhea 05/18/2019  . Anxiety 04/07/2019  . Bloating 04/07/2019   Social History   Tobacco Use  . Smoking status: Current Every Day Smoker    Packs/day: 0.25    Types: Cigarettes  . Smokeless tobacco: Never Used  Vaping Use  . Vaping Use: Never used  Substance Use Topics  . Alcohol  use: Yes    Alcohol/week: 1.0 standard drink    Types: 1 Standard drinks or equivalent per week  . Drug use: Yes    Types: Other-see comments, Marijuana    Comment: CBD   Allergies  Allergen Reactions  . Corn-Containing Products     Abdominal pain  . Shellfish Allergy     Abdominal pain  . Adhesive [Tape] Rash  . Latex Rash       Medications: Outpatient Medications Prior to Visit  Medication Sig  . fluticasone (FLONASE) 50 MCG/ACT nasal spray Place into the nose.  Marland Kitchen HYDROcodone-acetaminophen (NORCO/VICODIN) 5-325 MG tablet Take 1 tablet by mouth  every 6 (six) hours as needed for moderate pain or severe pain.  Marland Kitchen levonorgestrel (MIRENA) 20 MCG/24HR IUD 1 each by Intrauterine route once.  . [DISCONTINUED] clindamycin (CLEOCIN T) 1 % lotion Apply 1 application topically daily.  . [DISCONTINUED] escitalopram (LEXAPRO) 20 MG tablet Take 1 tablet (20 mg total) by mouth daily.  . [EXPIRED] metroNIDAZOLE (FLAGYL) 500 MG tablet Take 1 tablet (500 mg total) by mouth 2 (two) times daily for 14 days. (Patient not taking: Reported on 04/29/2020)  . ondansetron (ZOFRAN ODT) 4 MG disintegrating tablet Take 1 tablet (4 mg total) by mouth every 8 (eight) hours as needed for nausea or vomiting. (Patient not taking: Reported on 04/29/2020)  . ondansetron (ZOFRAN) 4 MG tablet Take 1 tablet (4 mg total) by mouth every 8 (eight) hours as needed. (Patient not taking: Reported on 04/29/2020)  . [DISCONTINUED] doxycycline (VIBRAMYCIN) 100 MG capsule Take 1 capsule (100 mg total) by mouth 2 (two) times daily for 14 days.  . [DISCONTINUED] sertraline (ZOLOFT) 100 MG tablet Take 1 tablet (100 mg total) by mouth daily. (Patient not taking: Reported on 04/12/2020)   No facility-administered medications prior to visit.    Review of Systems  Last CBC Lab Results  Component Value Date   WBC 11.2 (H) 04/15/2020   HGB 13.5 04/15/2020   HCT 38.9 04/15/2020   MCV 88.4 04/15/2020   MCH 30.7 04/15/2020   RDW 12.7 04/15/2020   PLT 329 04/15/2020      Objective    BP 118/68   Pulse 66   Temp (!) 96.6 F (35.9 C) (Oral)   Resp 16   Wt 186 lb 12.8 oz (84.7 kg)   LMP  (Within Weeks)   SpO2 98%   BMI 34.17 kg/m    Physical Exam Constitutional:      Appearance: Normal appearance.  Cardiovascular:     Rate and Rhythm: Normal rate and regular rhythm.  Pulmonary:     Effort: Pulmonary effort is normal.     Breath sounds: Normal breath sounds.  Abdominal:     General: Abdomen is flat. Bowel sounds are normal.     Tenderness: There is no rebound.  Skin:     General: Skin is warm and dry.  Neurological:     General: No focal deficit present.     Mental Status: She is alert and oriented to person, place, and time.  Psychiatric:        Mood and Affect: Mood normal.        Behavior: Behavior normal.       No results found for any visits on 04/29/20.  Assessment & Plan    1. Exposure to STD  Did not complete full treatment. Retest as below, positive for gonorrhea which should have been treated by rocephin.  - Urine cytology ancillary only  2.  Allergic dermatitis  - clindamycin (CLEOCIN T) 1 % lotion; Apply 1 application topically daily.  Dispense: 60 mL; Refill: 0 - triamcinolone cream (KENALOG) 0.1 %; Apply 1 application topically 2 (two) times daily.  Dispense: 30 g; Refill: 0  3. Anxiety  Reports continued anxiety. Continue Lexapro. Refer to CCM for resources, especially extended hours counseling.  - Ambulatory referral to Chronic Care Management Services - escitalopram (LEXAPRO) 20 MG tablet; Take 1 tablet (20 mg total) by mouth daily.  Dispense: 90 tablet; Refill: 3  4. Chronic idiopathic constipation  - Plecanatide (TRULANCE) 3 MG TABS; Take 3 mg by mouth daily.  Dispense: 90 tablet; Refill: 0  5. Liver mass  Recommend Follow up MRI. May need referral to GI.        I, Trinna Post, PA-C, have reviewed all documentation for this visit. The documentation on 04/30/20 for the exam, diagnosis, procedures, and orders are all accurate and complete.    Paulene Floor  [facility] [phone] (phone) [phone] (fax)  [place]

## 2020-05-01 ENCOUNTER — Ambulatory Visit: Payer: Self-pay | Admitting: *Deleted

## 2020-05-01 NOTE — Chronic Care Management (AMB) (Signed)
  Chronic Care Management    Clinical Social Work Follow Up Note  05/01/2020 Name: Elizabeth Marks MRN: [id number] DOB: 04-07-1981  Elizabeth Marks is a 39 y.o. year old female who is a primary care patient of Trinna Post, [place]. The CCM team was consulted for assistance with Mental Health Counseling and Resources.   Patient discussed having limited time to talk but was able to schedule some time on 05/02/20 at 8:30am.  Review of patient status, including review of consultants reports, other relevant assessments, and collaboration with appropriate care team members and the patient's provider was performed as part of comprehensive patient evaluation and provision of chronic care management services.    SDOH (Social Determinants of Health) assessments performed: No    Outpatient Encounter Medications as of 05/01/2020  Medication Sig  . clindamycin (CLEOCIN T) 1 % lotion Apply 1 application topically daily.  Marland Kitchen escitalopram (LEXAPRO) 20 MG tablet Take 1 tablet (20 mg total) by mouth daily.  . fluticasone (FLONASE) 50 MCG/ACT nasal spray Place into the nose.  Marland Kitchen HYDROcodone-acetaminophen (NORCO/VICODIN) 5-325 MG tablet Take 1 tablet by mouth every 6 (six) hours as needed for moderate pain or severe pain.  Marland Kitchen levonorgestrel (MIRENA) 20 MCG/24HR IUD 1 each by Intrauterine route once.  . ondansetron (ZOFRAN ODT) 4 MG disintegrating tablet Take 1 tablet (4 mg total) by mouth every 8 (eight) hours as needed for nausea or vomiting. (Patient not taking: Reported on 04/29/2020)  . ondansetron (ZOFRAN) 4 MG tablet Take 1 tablet (4 mg total) by mouth every 8 (eight) hours as needed. (Patient not taking: Reported on 04/29/2020)  . Plecanatide (TRULANCE) 3 MG TABS Take 3 mg by mouth daily.  Marland Kitchen triamcinolone cream (KENALOG) 0.1 % Apply 1 application topically 2 (two) times daily.   No facility-administered encounter medications on file as of 05/01/2020.     Goals Addressed   None      Follow Up  Plan: Appointment scheduled for SW follow up with client by phone on: 05/02/20    Elliot Gurney, [place] Worker  [facility] Care Management [phone]

## 2020-05-02 ENCOUNTER — Telehealth: Payer: Self-pay

## 2020-05-02 ENCOUNTER — Ambulatory Visit: Payer: Self-pay | Admitting: *Deleted

## 2020-05-02 DIAGNOSIS — F419 Anxiety disorder, unspecified: Secondary | ICD-10-CM

## 2020-05-02 DIAGNOSIS — R1084 Generalized abdominal pain: Secondary | ICD-10-CM

## 2020-05-02 NOTE — Chronic Care Management (AMB) (Signed)
Care Management    Clinical Social Work Follow Up Note  05/02/2020 Name: Elizabeth Marks MRN: [id number] DOB: 1980/12/28  Elizabeth Marks is a 39 y.o. year old female who is a primary care patient of Trinna Post, [place]. The CCM team was consulted for assistance with Mental Health Counseling and Resources.   Review of patient status, including review of consultants reports, other relevant assessments, and collaboration with appropriate care team members and the patient's provider was performed as part of comprehensive patient evaluation and provision of chronic care management services.    SDOH (Social Determinants of Health) assessments performed: Yes SDOH Interventions     Most Recent Value  SDOH Interventions  Stress Interventions Other (Comment)  [CSW to refer patient to ongoing mental health counseling]       Outpatient Encounter Medications as of 05/02/2020  Medication Sig   clindamycin (CLEOCIN T) 1 % lotion Apply 1 application topically daily.   escitalopram (LEXAPRO) 20 MG tablet Take 1 tablet (20 mg total) by mouth daily.   fluticasone (FLONASE) 50 MCG/ACT nasal spray Place into the nose.   HYDROcodone-acetaminophen (NORCO/VICODIN) 5-325 MG tablet Take 1 tablet by mouth every 6 (six) hours as needed for moderate pain or severe pain.   levonorgestrel (MIRENA) 20 MCG/24HR IUD 1 each by Intrauterine route once.   ondansetron (ZOFRAN ODT) 4 MG disintegrating tablet Take 1 tablet (4 mg total) by mouth every 8 (eight) hours as needed for nausea or vomiting. (Patient not taking: Reported on 04/29/2020)   ondansetron (ZOFRAN) 4 MG tablet Take 1 tablet (4 mg total) by mouth every 8 (eight) hours as needed. (Patient not taking: Reported on 04/29/2020)   Plecanatide (TRULANCE) 3 MG TABS Take 3 mg by mouth daily.   triamcinolone cream (KENALOG) 0.1 % Apply 1 application topically 2 (two) times daily.   No facility-administered encounter medications on file as of 05/02/2020.       Goals Addressed            This Visit's Progress    "I would like a therapist" re-started 05/02/20       Current Barriers:   Mental Health Concerns   Clinical Social Work Clinical Goal(s):   Over the next 90 days, patient will follow up with a local mental health provider of choice* as directed by SW  Interventions:  Confirmed that new referral received to assist with resources for counseling and mental health services  Patient discussed experiencing increased anxiety, specifically related to law enforcement  Emotional support providing along with recommendation for ongoing therapy with a therapist that specialized in PTSD  Patient discussed change in medication which seems to be working better  Discussed plan to refer patient to the [facility] specializing in trauma  Patient provided with encouragement and positive reinforcement to follow up with a mental health provided once arranged  Discussed plans with patient for ongoing care management follow up and provided patient with direct contact information for care management team   Patient Self Care Activities:   Patient verbalizes understanding of plan to contact a mental health therapist to schedule and initial appointment  Knowledge deficit of local mental health therapist  Please see past updates related to this goal by clicking on the "Past Updates" button in the selected goal          Follow Up Plan: SW will follow up with patient by phone over the next 7-14 business days    [employer], [place] Social Worker  [facility] [phone]

## 2020-05-02 NOTE — Patient Instructions (Addendum)
Thank you allowing the Chronic Care Management Team to be a part of your care! It was a pleasure speaking with you today!  1. Please anticipate referral to a therapist that specializes in trauma  CCM (Chronic Care Management) Team   Neldon Labella RN, BSN Nurse Care Coordinator  [phone]  [place], LCSW Clinical Social Worker [phone]  Goals Addressed            This Visit's Progress   . "I would like a therapist" re-started 05/02/20       Current Barriers:  Marland Kitchen Mental Health Concerns   Clinical Social Work Clinical Goal(s):  Marland Kitchen Over the next 90 days, patient will follow up with a local mental health provider of choice* as directed by SW  Interventions: . Confirmed that new referral received to assist with resources for counseling and mental health services . 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 Emotional support providing along with recommendation for ongoing therapy with a therapist that specialized in PTSD . Patient discussed change in medication which seems to be working better . Discussed plan to refer patient to the [facility] specializing in trauma . Patient provided with encouragement and positive reinforcement to follow up with a mental health provided once arranged . Discussed plans with patient for ongoing care management follow up and provided patient with direct contact information for care management team   Patient Self Care Activities:  . Patient verbalizes understanding of plan to contact a mental health therapist to schedule and initial appointment . Knowledge deficit of local mental health therapist  Please see past updates related to this goal by clicking on the "Past Updates" button in the selected goal          The patient verbalized understanding of instructions provided today and declined a print copy of patient instruction materials.   Telephone follow up  appointment with care management team member scheduled for: 05/14/20

## 2020-05-03 LAB — URINE CYTOLOGY ANCILLARY ONLY
Bacterial Vaginitis-Urine: NEGATIVE
Candida Urine: NEGATIVE
Chlamydia: NEGATIVE
Comment: NEGATIVE
Comment: NEGATIVE
Comment: NORMAL
Neisseria Gonorrhea: NEGATIVE
Trichomonas: NEGATIVE

## 2020-05-14 ENCOUNTER — Telehealth: Payer: Self-pay

## 2020-05-15 ENCOUNTER — Ambulatory Visit: Payer: Self-pay | Admitting: *Deleted

## 2020-05-15 DIAGNOSIS — R1084 Generalized abdominal pain: Secondary | ICD-10-CM

## 2020-05-15 DIAGNOSIS — F419 Anxiety disorder, unspecified: Secondary | ICD-10-CM

## 2020-05-15 NOTE — Patient Instructions (Signed)
Thank you allowing the Chronic Care Management Team to be a part of your care! It was a pleasure speaking with you today!  1. Please call the [facility] program to schedule an appointment [phone].  CCM (Chronic Care Management) Team   Neldon Labella  RN, BSN Nurse Care Coordinator  [phone]  [place], LCSW Clinical Social Worker [phone]  Goals Addressed            This Visit's Progress    "I would like a therapist" re-started 05/02/20       Current Barriers:   Mental Health Concerns   Clinical Social Work Clinical Goal(s):   Over the next 90 days, patient will follow up with a local mental health provider of choice* as directed by SW  Interventions:  Followed up with patient regarding referral  to the [facility]-agency specializing in trauma  CSW confirmed that the [facility] does not accept patient's insurance  Discussed follow up with another agency [facility]-contact information provided  Patient provided with encouragement and positive reinforcement to follow up with a mental health provided once arranged  Discussed plans with patient for ongoing care management follow up and provided patient with direct contact information for care management team   Patient Self Care Activities:   Patient verbalizes understanding of plan to contact a mental health therapist to schedule and initial appointment  Knowledge deficit of local mental health therapist  Please see past updates related to this goal by clicking on the "Past Updates" button in the selected goal          The patient verbalized understanding of instructions provided today and declined a print copy of patient instruction materials.   Telephone follow up appointment with care management team member scheduled for: 05/22/20

## 2020-05-15 NOTE — Chronic Care Management (AMB) (Signed)
  Care Management    Clinical Social Work Follow Up Note  05/15/2020 Name: Elizabeth Marks MRN: [id number] DOB: [birth year]  [name] is a 39 y.o. year old female who is a primary care patient of Trinna Post, [place]. The CCM team was consulted for assistance with Mental Health Counseling and Resources.   Review of patient status, including review of consultants reports, other relevant assessments, and collaboration with appropriate care team members and the patient's provider was performed as part of comprehensive patient evaluation and provision of chronic care management services.    SDOH (Social Determinants of Health) assessments performed: No    Outpatient Encounter Medications as of 05/15/2020  Medication Sig  . clindamycin (CLEOCIN T) 1 % lotion Apply 1 application topically daily.  Marland Kitchen escitalopram (LEXAPRO) 20 MG tablet Take 1 tablet (20 mg total) by mouth daily.  . fluticasone (FLONASE) 50 MCG/ACT nasal spray Place into the nose.  Marland Kitchen HYDROcodone-acetaminophen (NORCO/VICODIN) 5-325 MG tablet Take 1 tablet by mouth every 6 (six) hours as needed for moderate pain or severe pain.  Marland Kitchen levonorgestrel (MIRENA) 20 MCG/24HR IUD 1 each by Intrauterine route once.  . ondansetron (ZOFRAN ODT) 4 MG disintegrating tablet Take 1 tablet (4 mg total) by mouth every 8 (eight) hours as needed for nausea or vomiting. (Patient not taking: Reported on 04/29/2020)  . ondansetron (ZOFRAN) 4 MG tablet Take 1 tablet (4 mg total) by mouth every 8 (eight) hours as needed. (Patient not taking: Reported on 04/29/2020)  . Plecanatide (TRULANCE) 3 MG TABS Take 3 mg by mouth daily.  Marland Kitchen triamcinolone cream (KENALOG) 0.1 % Apply 1 application topically 2 (two) times daily.   No facility-administered encounter medications on file as of 05/15/2020.     Goals Addressed            This Visit's Progress   . "I would like a therapist" re-started 05/02/20       Current Barriers:  Marland Kitchen Mental Health Concerns    Clinical Social Work Clinical Goal(s):  Marland Kitchen Over the next 90 days, patient will follow up with a local mental health provider of choice* as directed by SW  Interventions: . Followed up with patient regarding referral  to the [facility] specializing in trauma . CSW confirmed that the [facility] does not accept patient's insurance . Discussed follow up with another agency [facility]-contact information provided . Patient provided with encouragement and positive reinforcement to follow up with a mental health provided once arranged . Discussed plans with patient for ongoing care management follow up and provided patient with direct contact information for care management team   Patient Self Care Activities:  . Patient verbalizes understanding of plan to contact a mental health therapist to schedule and initial appointment . Knowledge deficit of local mental health therapist  Please see past updates related to this goal by clicking on the "Past Updates" button in the selected goal          Follow Up Plan: SW will follow up with patient by phone over the next 7-14 business days    [place], [place] Worker  [facility] [phone]

## 2020-05-22 ENCOUNTER — Ambulatory Visit: Payer: Self-pay | Admitting: *Deleted

## 2020-05-22 DIAGNOSIS — F419 Anxiety disorder, unspecified: Secondary | ICD-10-CM

## 2020-05-22 NOTE — Chronic Care Management (AMB) (Signed)
   Care Management    Clinical Social Work Follow Up Note  05/22/2020 Name: Elizabeth Marks MRN: [id number] DOB: 18-Feb-1981  Elizabeth Marks is a 39 y.o. year old female who is a primary care patient of Trinna Post, [place]. The CCM team was consulted for assistance with Mental Health Counseling and Resources.   Review of patient status, including review of consultants reports, other relevant assessments, and collaboration with appropriate care team members and the patient's provider was performed as part of comprehensive patient evaluation and provision of chronic care management services.    SDOH (Social Determinants of Health) assessments performed: No    Outpatient Encounter Medications as of 05/22/2020  Medication Sig  . clindamycin (CLEOCIN T) 1 % lotion Apply 1 application topically daily.  Marland Kitchen escitalopram (LEXAPRO) 20 MG tablet Take 1 tablet (20 mg total) by mouth daily.  . fluticasone (FLONASE) 50 MCG/ACT nasal spray Place into the nose.  Marland Kitchen HYDROcodone-acetaminophen (NORCO/VICODIN) 5-325 MG tablet Take 1 tablet by mouth every 6 (six) hours as needed for moderate pain or severe pain.  Marland Kitchen levonorgestrel (MIRENA) 20 MCG/24HR IUD 1 each by Intrauterine route once.  . ondansetron (ZOFRAN ODT) 4 MG disintegrating tablet Take 1 tablet (4 mg total) by mouth every 8 (eight) hours as needed for nausea or vomiting. (Patient not taking: Reported on 04/29/2020)  . ondansetron (ZOFRAN) 4 MG tablet Take 1 tablet (4 mg total) by mouth every 8 (eight) hours as needed. (Patient not taking: Reported on 04/29/2020)  . Plecanatide (TRULANCE) 3 MG TABS Take 3 mg by mouth daily.  Marland Kitchen triamcinolone cream (KENALOG) 0.1 % Apply 1 application topically 2 (two) times daily.   No facility-administered encounter medications on file as of 05/22/2020.     Goals Addressed            This Visit's Progress   . "I would like a therapist" re-started 05/02/20       Current Barriers:  Marland Kitchen Mental Health Concerns    Clinical Social Work Clinical Goal(s):  Marland Kitchen Over the next 90 days, patient will follow up with a local mental health provider of choice* as directed by SW  Interventions: . Continued follow up with patient regarding referral to [facility]-contact information provided . Patient discussed plan to follow up with this agency in August when her availability opens up  . Patient provided with encouragement and positive reinforcement to follow up with a mental health provided to further process her symptoms anxiety and family conflict once arranged-self care emphasized . Discussed plans with patient for ongoing care management follow up and provided patient with direct contact information for care management team   Patient Self Care Activities:  . Patient verbalizes understanding of plan to contact a mental health therapist to schedule and initial appointment . Knowledge deficit of local in network mental health therapist  Please see past updates related to this goal by clicking on the "Past Updates" button in the selected goal          Follow Up Plan: Client will follow up with referral for ongoing mental health counseling    Elliot Gurney, [place] Worker  [facility] Care Management [phone]

## 2020-05-22 NOTE — Patient Instructions (Signed)
Thank you allowing the Chronic Care Management Team to be a part of your care! It was a pleasure speaking with you today!  1. Please follow up with the mental health provider of your choice when your schedule permits.  CCM (Chronic Care Management) Team   Neldon Labella  RN, BSN Nurse Care Coordinator  [phone]   [place], LCSW Clinical Social Worker [phone]  Goals Addressed            This Visit's Progress   . "I would like a therapist" re-started 05/02/20       Current Barriers:  Marland Kitchen Mental Health Concerns   Clinical Social Work Clinical Goal(s):  Marland Kitchen Over the next 90 days, patient will follow up with a local mental health provider of choice* as directed by SW  Interventions: . Continued follow up with patient regarding referral to [facility]-contact information provided . Patient discussed plan to follow up with this agency in August when her availability opens up  . Patient provided with encouragement and positive reinforcement to follow up with a mental health provided to further process her symptoms anxiety and family conflict once arranged-self care emphasized . Discussed plans with patient for ongoing care management follow up and provided patient with direct contact information for care management team   Patient Self Care Activities:  . Patient verbalizes understanding of plan to contact a mental health therapist to schedule and initial appointment . Knowledge deficit of local in network mental health therapist  Please see past updates related to this goal by clicking on the "Past Updates" button in the selected goal          The patient verbalized understanding of instructions provided today and declined a print copy of patient instruction materials.   No further follow up required: Mental health resources provided-patient agrees to follow up

## 2020-05-30 NOTE — Progress Notes (Deleted)
     Established patient visit   Patient: Elizabeth Marks   DOB: November 10, 1980   39 y.o. Female  MRN: [id number] Visit Date: 05/31/2020  Today's healthcare provider: Trinna Post, PA-C   No chief complaint on file.  Subjective    HPI  ***  {Show patient history (optional):23778::" "}   Medications: Outpatient Medications Prior to Visit  Medication Sig  . clindamycin (CLEOCIN T) 1 % lotion Apply 1 application topically daily.  Marland Kitchen escitalopram (LEXAPRO) 20 MG tablet Take 1 tablet (20 mg total) by mouth daily.  . fluticasone (FLONASE) 50 MCG/ACT nasal spray Place into the nose.  Marland Kitchen HYDROcodone-acetaminophen (NORCO/VICODIN) 5-325 MG tablet Take 1 tablet by mouth every 6 (six) hours as needed for moderate pain or severe pain.  Marland Kitchen levonorgestrel (MIRENA) 20 MCG/24HR IUD 1 each by Intrauterine route once.  . ondansetron (ZOFRAN ODT) 4 MG disintegrating tablet Take 1 tablet (4 mg total) by mouth every 8 (eight) hours as needed for nausea or vomiting. (Patient not taking: Reported on 04/29/2020)  . ondansetron (ZOFRAN) 4 MG tablet Take 1 tablet (4 mg total) by mouth every 8 (eight) hours as needed. (Patient not taking: Reported on 04/29/2020)  . Plecanatide (TRULANCE) 3 MG TABS Take 3 mg by mouth daily.  Marland Kitchen triamcinolone cream (KENALOG) 0.1 % Apply 1 application topically 2 (two) times daily.   No facility-administered medications prior to visit.    Review of Systems  {Heme  Chem  Endocrine  Serology  Results Review (optional):23779::" "}  Objective    There were no vitals taken for this visit. {Show previous vital signs (optional):23777::" "}  Physical Exam  ***  No results found for any visits on 05/31/20.  Assessment & Plan     ***  No follow-ups on file.      {provider attestation***:1}   Paulene Floor  [facility] [phone] (phone) [phone] (fax)  [place]

## 2020-05-31 ENCOUNTER — Ambulatory Visit: Payer: 59 | Admitting: Physician Assistant

## 2020-05-31 NOTE — Progress Notes (Signed)
Established patient visit   Patient: Elizabeth Marks   DOB: August 28, 1981   39 y.o. Female  MRN: [id number] Visit Date: 06/03/2020  Today's healthcare provider: Trinna Post, PA-C   Chief Complaint  Patient presents with  . Vaginal Discharge   Subjective    Vaginal Discharge The patient's primary symptoms include genital itching and vaginal discharge. The patient's pertinent negatives include no genital odor or pelvic pain. This is a new problem. The current episode started in the past 7 days. The problem occurs daily. The patient is experiencing no pain. Associated symptoms include frequency. Pertinent negatives include no chills, discolored urine, fever, nausea or vomiting. The vaginal discharge was clear. The vaginal bleeding is spotting. She has tried nothing for the symptoms. She is sexually active.    Patient was seen at the hospital 2 months ago and was told she had a STI. She was positive for gonorrhea on 04/15/2020 and was treated in the ER. Follow up test 04/29/2020 was negative. Now patient reports having sex with the same partner and her symptoms have returned. She is unsure if he was treated for his STI.      Medications: Outpatient Medications Prior to Visit  Medication Sig  . clindamycin (CLEOCIN T) 1 % lotion Apply 1 application topically daily.  Marland Kitchen escitalopram (LEXAPRO) 20 MG tablet Take 1 tablet (20 mg total) by mouth daily.  . fluticasone (FLONASE) 50 MCG/ACT nasal spray Place into the nose.  Marland Kitchen HYDROcodone-acetaminophen (NORCO/VICODIN) 5-325 MG tablet Take 1 tablet by mouth every 6 (six) hours as needed for moderate pain or severe pain.  Marland Kitchen levonorgestrel (MIRENA) 20 MCG/24HR IUD 1 each by Intrauterine route once.  . ondansetron (ZOFRAN ODT) 4 MG disintegrating tablet Take 1 tablet (4 mg total) by mouth every 8 (eight) hours as needed for nausea or vomiting. (Patient not taking: Reported on 04/29/2020)  . ondansetron (ZOFRAN) 4 MG tablet Take 1 tablet (4 mg  total) by mouth every 8 (eight) hours as needed. (Patient not taking: Reported on 04/29/2020)  . Plecanatide (TRULANCE) 3 MG TABS Take 3 mg by mouth daily.  Marland Kitchen triamcinolone cream (KENALOG) 0.1 % Apply 1 application topically 2 (two) times daily.   No facility-administered medications prior to visit.    Review of Systems  Constitutional: Negative.  Negative for chills and fever.  Respiratory: Negative.   Cardiovascular: Negative.   Gastrointestinal: Negative for nausea and vomiting.  Genitourinary: Positive for frequency and vaginal discharge. Negative for pelvic pain.  Neurological: Negative.     {Heme  Chem  Endocrine  Serology  Results Review (optional):23779::" "}  Objective    BP 108/66 (BP Location: Right Arm, Patient Position: Sitting, Cuff Size: Large)   Pulse 76   Temp 98.5 F (36.9 C) (Temporal)   Wt 184 lb (83.5 kg)   BMI 33.65 kg/m    Physical Exam Vitals reviewed.  Constitutional:      Appearance: Normal appearance.  HENT:     Head: Normocephalic and atraumatic.     Right Ear: Tympanic membrane normal.     Left Ear: Tympanic membrane normal.     Mouth/Throat:     Mouth: Mucous membranes are moist.     Pharynx: Oropharynx is clear.  Eyes:     General: No scleral icterus.    Conjunctiva/sclera: Conjunctivae normal.  Neck:     Vascular: No carotid bruit.  Cardiovascular:     Rate and Rhythm: Normal rate and regular rhythm.  Pulses: Normal pulses.     Heart sounds: Normal heart sounds.  Pulmonary:     Effort: Pulmonary effort is normal.     Breath sounds: Normal breath sounds.  Abdominal:     General: Bowel sounds are normal.     Palpations: Abdomen is soft.  Musculoskeletal:     Cervical back: Normal range of motion and neck supple.     Right lower leg: No edema.     Left lower leg: No edema.  Skin:    General: Skin is warm and dry.  Neurological:     General: No focal deficit present.     Mental Status: She is alert and oriented to person,  place, and time.  Psychiatric:        Mood and Affect: Mood normal.        Behavior: Behavior normal.        Thought Content: Thought content normal.        Judgment: Judgment normal.       Results for orders placed or performed in visit on 06/03/20  POCT urinalysis dipstick  Result Value Ref Range   Color, UA     Clarity, UA     Glucose, UA Negative Negative   Bilirubin, UA negative    Ketones, UA negative    Spec Grav, UA 1.010 1.010 - 1.025   Blood, UA negative    pH, UA 5.0 5.0 - 8.0   Protein, UA Negative Negative   Urobilinogen, UA 0.2 0.2 or 1.0 E.U./dL   Nitrite, UA negative    Leukocytes, UA Moderate (2+) (A) Negative   Appearance     Odor      Assessment & Plan    1. Vaginal discharge  Self collected swab. Will send off. Treat empirically for gonorrhea and chlamydia. Can adjust treatment pending results.   - CULTURE, URINE COMPREHENSIVE - POCT urinalysis dipstick - Cervicovaginal ancillary only - cefTRIAXone (ROCEPHIN) injection 500 mg - azithromycin (ZITHROMAX) 500 MG tablet; Take 2 tablets (1,000 mg total) by mouth once for 1 dose.  Dispense: 2 tablet; Refill: 0 - fluconazole (DIFLUCAN) 150 MG tablet; TAKE 1 TABLET ON DAY ONE AND TAKE 1 TABLET 3 DAYS LATER IF STILL HAVING SYMPTOMS.  Dispense: 2 tablet; Refill: 0  2. Gonorrhea  - cefTRIAXone (ROCEPHIN) injection 500 mg - azithromycin (ZITHROMAX) 500 MG tablet; Take 2 tablets (1,000 mg total) by mouth once for 1 dose.  Dispense: 2 tablet; Refill: 0    Return if symptoms worsen or fail to improve.      ITrinna Post, PA-C, have reviewed all documentation for this visit. The documentation on 06/04/20 for the exam, diagnosis, procedures, and orders are all accurate and complete.    Elizabeth Marks  [facility] [phone] (phone) [phone] (fax)  [place]

## 2020-06-03 ENCOUNTER — Ambulatory Visit (INDEPENDENT_AMBULATORY_CARE_PROVIDER_SITE_OTHER): Payer: 59 | Admitting: Physician Assistant

## 2020-06-03 ENCOUNTER — Encounter: Payer: Self-pay | Admitting: Physician Assistant

## 2020-06-03 ENCOUNTER — Other Ambulatory Visit: Payer: Self-pay

## 2020-06-03 ENCOUNTER — Other Ambulatory Visit (HOSPITAL_COMMUNITY)
Admission: RE | Admit: 2020-06-03 | Discharge: 2020-06-03 | Disposition: A | Discharge status: Home / Self Care | Payer: 59 | Source: Ambulatory Visit | Attending: Physician Assistant | Admitting: Physician Assistant

## 2020-06-03 VITALS — BP 108/66 | HR 76 | Temp 98.5°F | Wt 184.0 lb

## 2020-06-03 DIAGNOSIS — A549 Gonococcal infection, unspecified: Secondary | ICD-10-CM

## 2020-06-03 DIAGNOSIS — N898 Other specified noninflammatory disorders of vagina: Secondary | ICD-10-CM | POA: Insufficient documentation

## 2020-06-03 MED ORDER — CEFTRIAXONE SODIUM 500 MG IJ SOLR
500.0000 mg | Freq: Once | INTRAMUSCULAR | Status: AC
  Administered 2020-06-03: 500 mg via INTRAMUSCULAR

## 2020-06-03 MED ORDER — AZITHROMYCIN 500 MG PO TABS: 1000.0000 mg | ORAL_TABLET | Freq: Once | ORAL | 0 refills | Status: AC

## 2020-06-03 MED ORDER — FLUCONAZOLE 150 MG PO TABS: ORAL_TABLET | ORAL | 0 refills | Status: DC

## 2020-06-04 ENCOUNTER — Encounter: Payer: Self-pay | Admitting: Physician Assistant

## 2020-06-04 LAB — POCT URINALYSIS DIPSTICK
Bilirubin, UA: NEGATIVE
Blood, UA: NEGATIVE
Glucose, UA: NEGATIVE
Ketones, UA: NEGATIVE
Nitrite, UA: NEGATIVE
Protein, UA: NEGATIVE
Spec Grav, UA: 1.01 (ref 1.010–1.025)
Urobilinogen, UA: 0.2 E.U./dL
pH, UA: 5 (ref 5.0–8.0)

## 2020-06-05 ENCOUNTER — Encounter: Payer: Self-pay | Admitting: Physician Assistant

## 2020-06-05 ENCOUNTER — Telehealth: Payer: Self-pay

## 2020-06-05 LAB — CERVICOVAGINAL ANCILLARY ONLY
Bacterial Vaginitis (gardnerella): NEGATIVE
Candida Glabrata: NEGATIVE
Candida Vaginitis: NEGATIVE
Chlamydia: NEGATIVE
Comment: NEGATIVE
Comment: NEGATIVE
Comment: NEGATIVE
Comment: NEGATIVE
Comment: NEGATIVE
Comment: NORMAL
Neisseria Gonorrhea: NEGATIVE
Trichomonas: NEGATIVE

## 2020-06-05 NOTE — Telephone Encounter (Signed)
Called to advise patient as below, no answer and voicemail full. Sent patient a Pharmacist, community message regarding this matter.

## 2020-06-05 NOTE — Telephone Encounter (Signed)
Copied from [place] [phone]. Topic: Quick Communication - See Telephone Encounter >> Jun 04, 2020  4:13 PM Loma [place] wrote: CRM for notification. See Telephone encounter for: 06/04/20. Pt had lab work yesterday and does not understand the results in [place] [facility] at [phone]

## 2020-06-05 NOTE — Telephone Encounter (Signed)
They are not complete. I will result them through MyChart when complete.

## 2020-06-07 LAB — CULTURE, URINE COMPREHENSIVE

## 2020-07-31 ENCOUNTER — Ambulatory Visit (INDEPENDENT_AMBULATORY_CARE_PROVIDER_SITE_OTHER): Payer: 59 | Admitting: Obstetrics and Gynecology

## 2020-07-31 ENCOUNTER — Other Ambulatory Visit: Payer: Self-pay

## 2020-07-31 ENCOUNTER — Encounter: Payer: Self-pay | Admitting: Obstetrics and Gynecology

## 2020-07-31 ENCOUNTER — Other Ambulatory Visit (HOSPITAL_COMMUNITY)
Admission: RE | Admit: 2020-07-31 | Discharge: 2020-07-31 | Disposition: A | Discharge status: Home / Self Care | Payer: 59 | Source: Ambulatory Visit | Attending: Obstetrics and Gynecology | Admitting: Obstetrics and Gynecology

## 2020-07-31 VITALS — BP 111/78 | HR 65 | Ht 63.5 in | Wt 183.7 lb

## 2020-07-31 DIAGNOSIS — D069 Carcinoma in situ of cervix, unspecified: Secondary | ICD-10-CM | POA: Diagnosis present

## 2020-07-31 NOTE — Addendum Note (Signed)
Addended by: Durwin Glaze on: 07/31/2020 04:02 PM   Modules accepted: Orders

## 2020-07-31 NOTE — Progress Notes (Signed)
HPI:      [name] is a 39 y.o. [phone] who LMP was No LMP recorded. (Menstrual status: IUD).  Subjective:   She presents today for second follow-up Pap after LEEP for CIN-3 with positive ECC. Last Pap showed LGSIL not 16,18 or 45 but positive for general high risk types.    Hx: The following portions of the patient's history were reviewed and updated as appropriate:             She  has a past medical history of Allergy and Anxiety (04/07/2019). She does not have any pertinent problems on file. She  has a past surgical history that includes Cesarean section; Tonsillectomy and adenoidectomy; Wisdom tooth extraction; LEEP (N/A, 08/25/2019); IUD removal (08/25/2019); and Intrauterine device (iud) insertion (08/25/2019). Her family history includes Aneurysm in her maternal aunt; Anxiety disorder in her son; Cancer in her father and sister; Diabetes in her mother; Fibroids in her mother; Kidney disease in her maternal aunt. She  reports that she has been smoking cigarettes. She has been smoking about 0.25 packs per day. She has never used smokeless tobacco. She reports current alcohol use of about 1.0 standard drink of alcohol per week. She reports current drug use. Drugs: Other-see comments and Marijuana. She has a current medication list which includes the following prescription(s): clindamycin, escitalopram, fluticasone, levonorgestrel, hydrocodone-acetaminophen, ondansetron, ondansetron, trulance, and triamcinolone cream. She is allergic to corn-containing products, shellfish allergy, adhesive [tape], and latex.       Review of Systems:  Review of Systems  Constitutional: Denied constitutional symptoms, night sweats, recent illness, fatigue, fever, insomnia and weight loss.  Eyes: Denied eye symptoms, eye pain, photophobia, vision change and visual disturbance.  Ears/Nose/Throat/Neck: Denied ear, nose, throat or neck symptoms, hearing loss, nasal discharge, sinus congestion and  sore throat.  Cardiovascular: Denied cardiovascular symptoms, arrhythmia, chest pain/pressure, edema, exercise intolerance, orthopnea and palpitations.  Respiratory: Denied pulmonary symptoms, asthma, pleuritic pain, productive sputum, cough, dyspnea and wheezing.  Gastrointestinal: Denied, gastro-esophageal reflux, melena, nausea and vomiting.  Genitourinary: Denied genitourinary symptoms including symptomatic vaginal discharge, pelvic relaxation issues, and urinary complaints.  Musculoskeletal: Denied musculoskeletal symptoms, stiffness, swelling, muscle weakness and myalgia.  Dermatologic: Denied dermatology symptoms, rash and scar.  Neurologic: Denied neurology symptoms, dizziness, headache, neck pain and syncope.  Psychiatric: Denied psychiatric symptoms, anxiety and depression.  Endocrine: Denied endocrine symptoms including hot flashes and night sweats.   Meds:   Current Outpatient Medications on File Prior to Visit  Medication Sig Dispense Refill  . clindamycin (CLEOCIN T) 1 % lotion Apply 1 application topically daily. 60 mL 0  . escitalopram (LEXAPRO) 20 MG tablet Take 1 tablet (20 mg total) by mouth daily. 90 tablet 3  . fluticasone (FLONASE) 50 MCG/ACT nasal spray Place into the nose.    . levonorgestrel (MIRENA) 20 MCG/24HR IUD 1 each by Intrauterine route once.    Marland Kitchen HYDROcodone-acetaminophen (NORCO/VICODIN) 5-325 MG tablet Take 1 tablet by mouth every 6 (six) hours as needed for moderate pain or severe pain. (Patient not taking: Reported on 07/31/2020) 12 tablet 0  . ondansetron (ZOFRAN ODT) 4 MG disintegrating tablet Take 1 tablet (4 mg total) by mouth every 8 (eight) hours as needed for nausea or vomiting. (Patient not taking: Reported on 04/29/2020) 20 tablet 0  . ondansetron (ZOFRAN) 4 MG tablet Take 1 tablet (4 mg total) by mouth every 8 (eight) hours as needed. (Patient not taking: Reported on 04/29/2020) 20 tablet 0  . Plecanatide (TRULANCE) 3 MG  TABS Take 3 mg by mouth daily.  (Patient not taking: Reported on 07/31/2020) 90 tablet 0  . triamcinolone cream (KENALOG) 0.1 % Apply 1 application topically 2 (two) times daily. 30 g 0   No current facility-administered medications on file prior to visit.    Objective:     Vitals:   07/31/20 1537  BP: 111/78  Pulse: 65              Physical examination   Pelvic:   Vulva: Normal appearance.  No lesions.  Vagina: No lesions or abnormalities noted.  Support: Normal pelvic support.  Urethra No masses tenderness or scarring.  Meatus Normal size without lesions or prolapse.  Cervix: Normal appearance.  No lesions.  Obviously status post procedure  Anus: Normal exam.  No lesions.  Perineum: Normal exam.  No lesions.        Bimanual   Uterus: Normal size.  Non-tender.  Mobile.  AV.  Adnexae: No masses.  Non-tender to palpation.  Cul-de-sac: Negative for abnormality.     Assessment:    [id number] Patient Active Problem List   Diagnosis Date Noted  . Hydradenitis 08/08/2019  . Abdominal pain 07/28/2019  . 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 Galactorrhea 05/18/2019  . Anxiety 04/07/2019  . Bloating 04/07/2019     1. High grade squamous intraepithelial lesion (HGSIL), grade 3 CIN, on biopsy of cervix     Status post LEEP  Second Pap smear after LEEP   Plan:            1.  Once again discussed dysplasia natural course and history, HPV discussed again.  2.  Pap performed -if low-grade follow-up in 6 months if high-grade recommend follow-up colposcopy.  Viral typing performed with Pap Orders No orders of the defined types were placed in this encounter.   No orders of the defined types were placed in this encounter.     F/U  Return in about 6 months (around 01/28/2021) for Annual Physical, We will contact her with any abnormal test results. I spent 21 minutes involved in the care of this patient preparing  to see the patient by obtaining and reviewing her medical history (including labs, imaging tests and prior procedures), documenting clinical information in the electronic health record (EHR), counseling and coordinating care plans, writing and sending prescriptions, ordering tests or procedures and directly communicating with the patient by discussing pertinent items from her history and physical exam as well as detailing my assessment and plan as noted above so that she has an informed understanding.  All of her questions were answered.  Finis Bud, M.D. 07/31/2020 3:52 PM

## 2020-08-01 ENCOUNTER — Ambulatory Visit: Payer: BLUE CROSS/BLUE SHIELD | Admitting: Obstetrics and Gynecology

## 2020-08-07 LAB — CYTOLOGY - PAP
Comment: NEGATIVE
Comment: NEGATIVE
Diagnosis: UNDETERMINED — AB
HPV 16: NEGATIVE
HPV 18 / 45: NEGATIVE
High risk HPV: POSITIVE — AB

## 2020-09-19 ENCOUNTER — Other Ambulatory Visit: Payer: Self-pay

## 2020-09-19 ENCOUNTER — Ambulatory Visit (INDEPENDENT_AMBULATORY_CARE_PROVIDER_SITE_OTHER): Payer: 59 | Admitting: Physician Assistant

## 2020-09-19 DIAGNOSIS — Z23 Encounter for immunization: Secondary | ICD-10-CM | POA: Diagnosis not present

## 2020-10-03 ENCOUNTER — Other Ambulatory Visit: Payer: Self-pay | Admitting: Obstetrics and Gynecology

## 2020-10-03 ENCOUNTER — Encounter: Payer: Self-pay | Admitting: Obstetrics and Gynecology

## 2020-10-03 ENCOUNTER — Ambulatory Visit (INDEPENDENT_AMBULATORY_CARE_PROVIDER_SITE_OTHER): Payer: 59 | Admitting: Obstetrics and Gynecology

## 2020-10-03 ENCOUNTER — Other Ambulatory Visit: Payer: Self-pay

## 2020-10-03 VITALS — BP 125/78 | HR 66 | Ht 63.5 in | Wt 192.4 lb

## 2020-10-03 DIAGNOSIS — N6452 Nipple discharge: Secondary | ICD-10-CM

## 2020-10-03 DIAGNOSIS — Z202 Contact with and (suspected) exposure to infections with a predominantly sexual mode of transmission: Secondary | ICD-10-CM | POA: Diagnosis not present

## 2020-10-03 DIAGNOSIS — R5383 Other fatigue: Secondary | ICD-10-CM

## 2020-10-03 LAB — POCT URINE PREGNANCY: Preg Test, Ur: NEGATIVE

## 2020-10-03 NOTE — Progress Notes (Signed)
HPI:      [name] is a 39 y.o. [phone] who LMP was No LMP recorded. (Menstrual status: IUD).  Subjective:   She presents today stating that she suddenly feels tired every day.  Has occasional nausea without vomiting.  She has noticed an increased fullness of her breasts and is having bilateral nipple discharge. She has not done a home pregnancy test. She has an IUD for birth control. She has a new sexual partner and she has some concerns.  She is requesting gonorrhea and Chlamydia testing from her urine.  She does not want to be examined today if possible. Of significant note, patient has had a LEEP and is being followed.  Her most recent Pap smear showed ASCUS.    Hx: The following portions of the patient's history were reviewed and updated as appropriate:             She  has a past medical history of Allergy and Anxiety (04/07/2019). She does not have any pertinent problems on file. She  has a past surgical history that includes Cesarean section; Tonsillectomy and adenoidectomy; Wisdom tooth extraction; LEEP (N/A, 08/25/2019); IUD removal (08/25/2019); and Intrauterine device (iud) insertion (08/25/2019). Her family history includes Aneurysm in her maternal aunt; Anxiety disorder in her son; Cancer in her father and sister; Diabetes in her mother; Fibroids in her mother; Kidney disease in her maternal aunt. She  reports that she has been smoking cigarettes. She has been smoking about 0.25 packs per day. She has never used smokeless tobacco. She reports current alcohol use of about 1.0 standard drink of alcohol per week. She reports current drug use. Drugs: Other-see comments and Marijuana. She has a current medication list which includes the following prescription(s): clindamycin, escitalopram, fluticasone, levonorgestrel, ondansetron, trulance, triamcinolone, and hydrocodone-acetaminophen. She is allergic to corn-containing products, shellfish allergy, adhesive [tape], and  latex.       Review of Systems:  Review of Systems  Constitutional: See HPI for additional information.  Eyes: Denied eye symptoms, eye pain, photophobia, vision change and visual disturbance.  Ears/Nose/Throat/Neck: Denied ear, nose, throat or neck symptoms, hearing loss, nasal discharge, sinus congestion and sore throat.  Cardiovascular: Denied cardiovascular symptoms, arrhythmia, chest pain/pressure, edema, exercise intolerance, orthopnea and palpitations.  Respiratory: Denied pulmonary symptoms, asthma, pleuritic pain, productive sputum, cough, dyspnea and wheezing.  Gastrointestinal: Denied, gastro-esophageal reflux, melena, nausea and vomiting.  Genitourinary: See HPI for additional information.  Musculoskeletal: Denied musculoskeletal symptoms, stiffness, swelling, muscle weakness and myalgia.  Dermatologic: Denied dermatology symptoms, rash and scar.  Neurologic: Denied neurology symptoms, dizziness, headache, neck pain and syncope.  Psychiatric: Denied psychiatric symptoms, anxiety and depression.  Endocrine: Denied endocrine symptoms including hot flashes and night sweats.   Meds:   Current Outpatient Medications on File Prior to Visit  Medication Sig Dispense Refill  . clindamycin (CLEOCIN T) 1 % lotion Apply 1 application topically daily. 60 mL 0  . escitalopram (LEXAPRO) 20 MG tablet Take 1 tablet (20 mg total) by mouth daily. 90 tablet 3  . fluticasone (FLONASE) 50 MCG/ACT nasal spray Place into the nose.    . levonorgestrel (MIRENA) 20 MCG/24HR IUD 1 each by Intrauterine route once.    . ondansetron (ZOFRAN ODT) 4 MG disintegrating tablet Take 1 tablet (4 mg total) by mouth every 8 (eight) hours as needed for nausea or vomiting. 20 tablet 0  . Plecanatide (TRULANCE) 3 MG TABS Take 3 mg by mouth daily. 90 tablet 0  . triamcinolone cream (KENALOG)  0.1 % Apply 1 application topically 2 (two) times daily. 30 g 0  . HYDROcodone-acetaminophen (NORCO/VICODIN) 5-325 MG tablet Take  1 tablet by mouth every 6 (six) hours as needed for moderate pain or severe pain. (Patient not taking: Reported on 07/31/2020) 12 tablet 0   No current facility-administered medications on file prior to visit.       The pregnancy intention screening data noted above was reviewed. Potential methods of contraception were discussed. The patient elected to proceed with IUD or IUS.     Objective:     Vitals:   10/03/20 0959  BP: 125/78  Pulse: 66   Filed Weights   10/03/20 0959  Weight: 192 lb 6.4 oz (87.3 kg)              Urinary pregnancy test negative  Assessment:    [id number] Patient Active Problem List   Diagnosis Date Noted  . Hydradenitis 08/08/2019  . Abdominal pain 07/28/2019  . 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 Galactorrhea 05/18/2019  . Anxiety 04/07/2019  . Bloating 04/07/2019     1. Feeling tired   2. Breast discharge   3. Possible exposure to STD     Patient is not pregnant as she assumed.   Plan:            1.  GC/CT performed of urine per patient request  2.  TSH, prolactin. Orders No orders of the defined types were placed in this encounter.   No orders of the defined types were placed in this encounter.     F/U  No follow-ups on file. I spent 23 minutes involved in the care of this patient preparing to see the patient by obtaining and reviewing her medical history (including labs, imaging tests and prior procedures), documenting clinical information in the electronic health record (EHR), counseling and coordinating care plans, writing and sending prescriptions, ordering tests or procedures and directly communicating with the patient by discussing pertinent items from her history and physical exam as well as detailing my assessment and plan as noted above so that she has an informed understanding.  All of her questions were answered.  Finis Bud, M.D. 10/03/2020 10:19 AM

## 2020-10-03 NOTE — Addendum Note (Signed)
Addended by: Durwin Glaze on: 10/03/2020 11:02 AM   Modules accepted: Orders

## 2020-10-04 ENCOUNTER — Other Ambulatory Visit: Payer: 59

## 2020-10-04 LAB — COMPREHENSIVE METABOLIC PANEL
ALT: 13 IU/L (ref 0–32)
AST: 14 IU/L (ref 0–40)
Albumin/Globulin Ratio: 1.7 (ref 1.2–2.2)
Albumin: 4.5 g/dL (ref 3.8–4.8)
Alkaline Phosphatase: 105 IU/L (ref 44–121)
BUN/Creatinine Ratio: 8 — ABNORMAL LOW (ref 9–23)
BUN: 6 mg/dL (ref 6–20)
Bilirubin Total: 0.2 mg/dL (ref 0.0–1.2)
CO2: 23 mmol/L (ref 20–29)
Calcium: 9.2 mg/dL (ref 8.7–10.2)
Chloride: 102 mmol/L (ref 96–106)
Creatinine, Ser: 0.8 mg/dL (ref 0.57–1.00)
GFR calc Af Amer: 107 mL/min/{1.73_m2} (ref >59)
GFR calc non Af Amer: 93 mL/min/{1.73_m2} (ref >59)
Globulin, Total: 2.6 g/dL (ref 1.5–4.5)
Glucose: 73 mg/dL (ref 65–99)
Potassium: 4.8 mmol/L (ref 3.5–5.2)
Sodium: 139 mmol/L (ref 134–144)
Total Protein: 7.1 g/dL (ref 6.0–8.5)

## 2020-10-04 LAB — CBC WITH DIFFERENTIAL/PLATELET
Basophils Absolute: 0 10*3/uL (ref 0.0–0.2)
Basos: 1 %
EOS (ABSOLUTE): 0.2 10*3/uL (ref 0.0–0.4)
Eos: 2 %
Hematocrit: 39.8 % (ref 34.0–46.6)
Hemoglobin: 13.6 g/dL (ref 11.1–15.9)
Immature Grans (Abs): 0 10*3/uL (ref 0.0–0.1)
Immature Granulocytes: 0 %
Lymphocytes Absolute: 2.5 10*3/uL (ref 0.7–3.1)
Lymphs: 29 %
MCH: 31.1 pg (ref 26.6–33.0)
MCHC: 34.2 g/dL (ref 31.5–35.7)
MCV: 91 fL (ref 79–97)
Monocytes Absolute: 0.8 10*3/uL (ref 0.1–0.9)
Monocytes: 10 %
Neutrophils Absolute: 5.1 10*3/uL (ref 1.4–7.0)
Neutrophils: 58 %
Platelets: 349 10*3/uL (ref 150–450)
RBC: 4.38 x10E6/uL (ref 3.77–5.28)
RDW: 13.2 % (ref 11.7–15.4)
WBC: 8.7 10*3/uL (ref 3.4–10.8)

## 2020-10-04 LAB — TSH: TSH: 1.13 u[IU]/mL (ref 0.450–4.500)

## 2020-10-04 LAB — PROLACTIN: Prolactin: 108 ng/mL — ABNORMAL HIGH (ref 4.8–23.3)

## 2020-10-05 LAB — GC/CHLAMYDIA PROBE AMP
Chlamydia trachomatis, NAA: NEGATIVE
Neisseria Gonorrhoeae by PCR: NEGATIVE

## 2020-10-16 ENCOUNTER — Ambulatory Visit (INDEPENDENT_AMBULATORY_CARE_PROVIDER_SITE_OTHER): Payer: 59 | Admitting: Obstetrics and Gynecology

## 2020-10-16 ENCOUNTER — Other Ambulatory Visit: Payer: Self-pay

## 2020-10-16 ENCOUNTER — Encounter: Payer: Self-pay | Admitting: Obstetrics and Gynecology

## 2020-10-16 VITALS — BP 116/81 | HR 63 | Ht 63.5 in | Wt 188.5 lb

## 2020-10-16 DIAGNOSIS — E221 Hyperprolactinemia: Secondary | ICD-10-CM

## 2020-10-16 MED ORDER — CABERGOLINE 0.5 MG PO TABS: 0.2500 mg | ORAL_TABLET | ORAL | 1 refills | Status: AC

## 2020-10-16 NOTE — Progress Notes (Signed)
HPI:      [name] is a 39 y.o. [phone] who LMP was No LMP recorded. (Menstrual status: IUD).  Subjective:   Elizabeth Marks presents today to discuss her elevated prolactin.  Elizabeth Marks has been experiencing breast tenderness and bilateral nipple discharge as well as general lethargy " I am tired all the time".  Elizabeth Marks has an IUD for birth control and is not having regular menses.    Hx: The following portions of the patient's history were reviewed and updated as appropriate:             Elizabeth Marks  has a past medical history of Allergy and Anxiety (04/07/2019). Elizabeth Marks does not have any pertinent problems on file. Elizabeth Marks  has a past surgical history that includes Cesarean section; Tonsillectomy and adenoidectomy; Wisdom tooth extraction; LEEP (N/A, 08/25/2019); IUD removal (08/25/2019); and Intrauterine device (iud) insertion (08/25/2019). Her family history includes Aneurysm in her maternal aunt; Anxiety disorder in her son; Cancer in her father and sister; Diabetes in her mother; Fibroids in her mother; Kidney disease in her maternal aunt. Elizabeth Marks  reports that Elizabeth Marks has been smoking cigarettes. Elizabeth Marks has been smoking about 0.25 packs per day. Elizabeth Marks has never used smokeless tobacco. Elizabeth Marks reports current alcohol use of about 1.0 standard drink of alcohol per week. Elizabeth Marks reports current drug use. Drugs: Other-see comments and Marijuana. Elizabeth Marks has a current medication list which includes the following prescription(s): clindamycin, escitalopram, fluticasone, levonorgestrel, trulance, triamcinolone, [START ON 10/17/2020] cabergoline, hydrocodone-acetaminophen, and ondansetron. Elizabeth Marks is allergic to corn-containing products, shellfish allergy, adhesive [tape], and latex.       Review of Systems:  Review of Systems  Constitutional: Denied constitutional symptoms, night sweats, recent illness, fatigue, fever, insomnia and weight loss.  Eyes: Denied eye symptoms, eye pain, photophobia, vision change and visual disturbance.   Ears/Nose/Throat/Neck: Denied ear, nose, throat or neck symptoms, hearing loss, nasal discharge, sinus congestion and sore throat.  Cardiovascular: Denied cardiovascular symptoms, arrhythmia, chest pain/pressure, edema, exercise intolerance, orthopnea and palpitations.  Respiratory: Denied pulmonary symptoms, asthma, pleuritic pain, productive sputum, cough, dyspnea and wheezing.  Gastrointestinal: Denied, gastro-esophageal reflux, melena, nausea and vomiting.  Genitourinary: Denied genitourinary symptoms including symptomatic vaginal discharge, pelvic relaxation issues, and urinary complaints.  Musculoskeletal: Denied musculoskeletal symptoms, stiffness, swelling, muscle weakness and myalgia.  Dermatologic: Denied dermatology symptoms, rash and scar.  Neurologic: Denied neurology symptoms, dizziness, headache, neck pain and syncope.  Psychiatric: Denied psychiatric symptoms, anxiety and depression.  Endocrine: See HPI for additional information.   Meds:   Current Outpatient Medications on File Prior to Visit  Medication Sig Dispense Refill   clindamycin (CLEOCIN T) 1 % lotion Apply 1 application topically daily. 60 mL 0   escitalopram (LEXAPRO) 20 MG tablet Take 1 tablet (20 mg total) by mouth daily. 90 tablet 3   fluticasone (FLONASE) 50 MCG/ACT nasal spray Place into the nose.     levonorgestrel (MIRENA) 20 MCG/24HR IUD 1 each by Intrauterine route once.     Plecanatide (TRULANCE) 3 MG TABS Take 3 mg by mouth daily. 90 tablet 0   triamcinolone cream (KENALOG) 0.1 % Apply 1 application topically 2 (two) times daily. 30 g 0   HYDROcodone-acetaminophen (NORCO/VICODIN) 5-325 MG tablet Take 1 tablet by mouth every 6 (six) hours as needed for moderate pain or severe pain. (Patient not taking: Reported on 10/16/2020) 12 tablet 0   ondansetron (ZOFRAN ODT) 4 MG disintegrating tablet Take 1 tablet (4 mg total) by mouth every 8 (eight) hours as needed for  nausea or vomiting. (Patient not  taking: Reported on 10/16/2020) 20 tablet 0   No current facility-administered medications on file prior to visit.       The pregnancy intention screening data noted above was reviewed. Potential methods of contraception were discussed. The patient elected to proceed with IUD or IUS.     Objective:     Vitals:   10/16/20 1011  BP: 116/81  Pulse: 63   Filed Weights   10/16/20 1011  Weight: 188 lb 8 oz (85.5 kg)              Elevated prolactin  Assessment:    G2P2002 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   Vaginal low risk HPV DNA test positive 05/18/2019   Galactorrhea 05/18/2019   Anxiety 04/07/2019   Bloating 04/07/2019     1. Hyperprolactinemia ([place])     Very likely this is the source of her above noted symptoms.   Plan:            1.  I discussed microadenoma versus macroadenoma and the pituitary gland.  Effects of elevated prolactin discussed with patient.  Plan is to begin Dostinex twice weekly.  Follow-up prolactin in 4 weeks dose adjustment as necessary. Orders Orders Placed This Encounter  Procedures   Prolactin     Meds ordered this encounter  Medications   cabergoline (DOSTINEX) 0.5 MG tablet    Sig: Take 0.5 tablets (0.25 mg total) by mouth 2 (two) times a week.    Dispense:  10 tablet    Refill:  1      F/U  Return in about 4 weeks (around 11/13/2020). I spent 22 minutes involved in the care of this patient preparing to see the patient by obtaining and reviewing her medical history (including labs, imaging tests and prior procedures), documenting clinical information in the electronic health record (EHR), counseling and coordinating care plans, writing and sending prescriptions, ordering tests or procedures and directly communicating with the patient by discussing pertinent items from  her history and physical exam as well as detailing my assessment and plan as noted above so that Elizabeth Marks has an informed understanding.  All of her questions were answered.  Finis Bud, M.D. 10/16/2020 10:40 AM

## 2020-11-07 ENCOUNTER — Ambulatory Visit: Payer: Self-pay | Admitting: *Deleted

## 2020-11-07 DIAGNOSIS — F419 Anxiety disorder, unspecified: Secondary | ICD-10-CM

## 2020-11-08 NOTE — Chronic Care Management (AMB) (Signed)
Care Management  Clinical Social Work Note  11/08/2020 Name: Elizabeth Marks MRN: [id number] DOB: 12/21/1980  Elizabeth Marks is a 40 y.o. year old female who is a primary care patient of Trinna Post, [place]. The CCM team was consulted for assistance with chronic disease management and care coordination needs.    Engaged with patient by telephone for follow up visit in response to provider referral for social work case management and/or care coordination services.   Assessment/Interventions: Review of patient past medical history, allergies, medications, and health status, including review of pertinent consultant reports was performed as part of comprehensive evaluation and provision of care management/care coordination services.   SDOH (Social Determinants of Health) screening and interventions performed today: No  Advanced Directives Status:Not addressed in this encounter.     Care Plan    Allergies  Allergen Reactions  . Corn-Containing Products     Abdominal pain  . Shellfish Allergy     Abdominal pain  . Adhesive [Tape] Rash  . Latex Rash    Outpatient Encounter Medications as of 11/07/2020  Medication Sig  . cabergoline (DOSTINEX) 0.5 MG tablet Take 0.5 tablets (0.25 mg total) by mouth 2 (two) times a week.  . clindamycin (CLEOCIN T) 1 % lotion Apply 1 application topically daily.  Marland Kitchen escitalopram (LEXAPRO) 20 MG tablet Take 1 tablet (20 mg total) by mouth daily.  . fluticasone (FLONASE) 50 MCG/ACT nasal spray Place into the nose.  Marland Kitchen HYDROcodone-acetaminophen (NORCO/VICODIN) 5-325 MG tablet Take 1 tablet by mouth every 6 (six) hours as needed for moderate pain or severe pain. (Patient not taking: Reported on 10/16/2020)  . levonorgestrel (MIRENA) 20 MCG/24HR IUD 1 each by Intrauterine route once.  . ondansetron (ZOFRAN ODT) 4 MG disintegrating tablet Take 1 tablet (4 mg total) by mouth every 8 (eight) hours as needed for nausea or vomiting. (Patient not taking: Reported  on 10/16/2020)  . Plecanatide (TRULANCE) 3 MG TABS Take 3 mg by mouth daily.  Marland Kitchen triamcinolone cream (KENALOG) 0.1 % Apply 1 application topically 2 (two) times daily.   No facility-administered encounter medications on file as of 11/07/2020.    Patient Active Problem List   Diagnosis Date Noted  . Hydradenitis 08/08/2019  . Abdominal pain 07/28/2019  . 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 Galactorrhea 05/18/2019  . Anxiety 04/07/2019  . Bloating 04/07/2019    Conditions to be addressed/monitored per PCP order:  Mental Health Concerns   Patient Care Plan: Anxiety (Adult)    Problem Identified: Anxiety Identification (Anxiety)     Problem Identified: Symptoms (Anxiety)     Goal: Anxiety Symptoms Monitored and Managed   Start Date: 11/07/2020  Expected End Date: 01/06/2021  This Visit's Progress: On track  Priority: High  Note:   Current Barriers:  Marland Kitchen Mental Health Concerns  . Challenges with Anxiety  Clinical Social Work Clinical Goal(s):  Marland Kitchen Over the next 90 days, patient will follow up with a mental health therapist* as directed by SW  Interventions: . 1:1 collaboration with Trinna Post, PA-C regarding development and update of comprehensive plan of care as evidenced by provider attestation and co-signature . Inter-disciplinary care team collaboration (see longitudinal plan of care) . Patient interviewed and appropriate assessments performed . Initial discussion involved collaboration in regards to patient's son  challenges with anxiety and assisting with transportation to mental health appointments of which patient  agreed to assist . Discussion continued regarding patient's own challenges with anxiety and relationship concerns . Emotional support provided, coping strategies explored, ongoing mental health counseling encouraged   Patient Goals/Self-Care Activities Over the  next 90 days, patient will:   - - begin personal counseling - talk about feelings with a friend, family or spiritual advisor - practice positive thinking and self-talk  Follow up Plan: SW will follow up with patient by phone over the next 14 business days    Task: Alleviate Barriers to Anxiety Treatment Success   Due Date: 01/06/2021  Priority: Routine  Note:   Care Management Activities:    - coping strategies encouraged - participation in counseling encouraged    Notes:    Problem Identified: Harm or Injury (Anxiety)       Follow Up Plan: Telephone follow up appointment with care management team member scheduled for:

## 2020-11-08 NOTE — Patient Instructions (Signed)
Visit Information  Patient Care Plan: Anxiety (Adult)    Problem Identified: Anxiety Identification (Anxiety)     Problem Identified: Symptoms (Anxiety)     Goal: Anxiety Symptoms Monitored and Managed   Start Date: 11/07/2020  Expected End Date: 01/06/2021  This Visit's Progress: On track  Priority: High  Note:   Current Barriers:  Marland Kitchen Mental Health Concerns  . Challenges with Anxiety  Clinical Social Work Clinical Goal(s):  Marland Kitchen Over the next 90 days, patient will follow up with a mental health therapist* as directed by SW  Interventions: . 1:1 collaboration with Trinna Post, PA-C regarding development and update of comprehensive plan of care as evidenced by provider attestation and co-signature . Inter-disciplinary care team collaboration (see longitudinal plan of care) . Patient interviewed and appropriate assessments performed . Initial discussion involved collaboration in regards to patient's son  challenges with anxiety and assisting with transportation to mental health appointments of which patient agreed to assist . Discussion continued regarding patient's own challenges with anxiety and relationship concerns . Emotional support provided, coping strategies explored, ongoing mental health counseling encouraged   Patient Goals/Self-Care Activities Over the next 90 days, patient will:   - - begin personal counseling - talk about feelings with a friend, family or spiritual advisor - practice positive thinking and self-talk  Follow up Plan: SW will follow up with patient by phone over the next 14 business days    Task: Alleviate Barriers to Anxiety Treatment Success   Due Date: 01/06/2021  Priority: Routine  Note:   Care Management Activities:    - coping strategies encouraged - participation in counseling encouraged    Notes:    Problem Identified: Harm or Injury (Anxiety)       The patient verbalized understanding of instructions, educational materials, and care  plan provided today and declined offer to receive copy of patient instructions, educational materials, and care plan.   Telephone follow up appointment with care management team member scheduled for: 11/13/20   Elliot Gurney, [place] Worker  [facility] [phone]

## 2020-11-13 ENCOUNTER — Other Ambulatory Visit: Payer: 59

## 2020-11-13 ENCOUNTER — Ambulatory Visit: Payer: Self-pay | Admitting: *Deleted

## 2020-11-13 DIAGNOSIS — F419 Anxiety disorder, unspecified: Secondary | ICD-10-CM

## 2020-11-13 NOTE — Chronic Care Management (AMB) (Signed)
Care Management Clinical Social Work Note  11/13/2020 Name: Elizabeth Marks MRN: [id number] DOB: 1981-01-09  Elizabeth Marks is a 40 y.o. year old female who is a primary care patient of Trinna Post, [place].  The Care Management team was consulted for assistance with chronic disease management and coordination needs.  Engaged with patient by telephone for follow up visit in response to provider referral for social work chronic care management and care coordination services  Consent to Services:  Elizabeth Marks was given information about Care Management services on 04/11/20 including:  1. Care Management services includes personalized support from designated clinical staff supervised by her physician, including individualized plan of care and coordination with other care providers 2. 24/7 contact phone numbers for assistance for urgent and routine care needs. 3. The patient may stop case management services at any time by phone call to the office staff.  Patient agreed to services and consent obtained.   Assessment/Interventions: Review of patient past medical history, allergies, medications, and health status, including review of relevant consultants reports was performed today as part of a comprehensive evaluation and provision of chronic care management and care coordination services.  SDOH (Social Determinants of Health) assessments and interventions performed:    Advanced Directives Status: Not addressed in this encounter.  Care Plan  Allergies  Allergen Reactions  . Corn-Containing Products     Abdominal pain  . Shellfish Allergy     Abdominal pain  . Adhesive [Tape] Rash  . Latex Rash    Outpatient Encounter Medications as of 11/13/2020  Medication Sig  . cabergoline (DOSTINEX) 0.5 MG tablet Take 0.5 tablets (0.25 mg total) by mouth 2 (two) times a week.  . clindamycin (CLEOCIN T) 1 % lotion Apply 1 application topically daily.  Marland Kitchen escitalopram (LEXAPRO) 20 MG tablet  Take 1 tablet (20 mg total) by mouth daily.  . fluticasone (FLONASE) 50 MCG/ACT nasal spray Place into the nose.  Marland Kitchen HYDROcodone-acetaminophen (NORCO/VICODIN) 5-325 MG tablet Take 1 tablet by mouth every 6 (six) hours as needed for moderate pain or severe pain. (Patient not taking: Reported on 10/16/2020)  . levonorgestrel (MIRENA) 20 MCG/24HR IUD 1 each by Intrauterine route once.  . ondansetron (ZOFRAN ODT) 4 MG disintegrating tablet Take 1 tablet (4 mg total) by mouth every 8 (eight) hours as needed for nausea or vomiting. (Patient not taking: Reported on 10/16/2020)  . Plecanatide (TRULANCE) 3 MG TABS Take 3 mg by mouth daily.  Marland Kitchen triamcinolone cream (KENALOG) 0.1 % Apply 1 application topically 2 (two) times daily.   No facility-administered encounter medications on file as of 11/13/2020.    Patient Active Problem List   Diagnosis Date Noted  . Hydradenitis 08/08/2019  . Abdominal pain 07/28/2019  . 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 Galactorrhea 05/18/2019  . Anxiety 04/07/2019  . Bloating 04/07/2019    Conditions to be addressed/monitored: Anxiety; Mental Health Concerns   Care Plan : Anxiety (Adult)  Updates made by Vern Claude, LCSW since 11/13/2020 12:00 AM    Problem: Symptoms (Anxiety)     Goal: Anxiety Symptoms Monitored and Managed   Start Date: 11/07/2020  Expected End Date: 01/06/2021  Recent Progress: On track  Priority: High  Note:   Current Barriers:  Marland Kitchen Mental Health Concerns  . Challenges with Anxiety  Clinical Social Work Clinical Goal(s):  Marland Kitchen Over the next 90 days, patient  will follow up with a mental health therapist as directed by SW  Interventions: . 1:1 collaboration with Trinna Post, PA-C regarding development and update of comprehensive plan of care as evidenced by provider attestation and co-signature . Inter-disciplinary care team  collaboration (see longitudinal plan of care) . Patient interviewed and appropriate assessments performed . Follow up provided on  patient's challenges with anxiety and relationship concerns . Patient confirmed increased ability to set healthy boundaries in her relationships . Emotional support continued to be provided,  coping strategies explored, self care emphasized, ongoing mental health counseling encouraged   Patient Goals/Self-Care Activities Over the next 90 days, patient will:   - - begin personal counseling - talk about feelings with a friend, family or spiritual advisor - practice positive thinking and self-talk  Follow up Plan: SW will follow up with patient by phone over the next 14 business days       Follow Up Plan: SW will follow up with patient by phone over the next 7-14 business days   [place], [place] Worker  [facility] Management [phone]

## 2020-11-13 NOTE — Patient Instructions (Signed)
Visit Information  Patient Care Plan: Anxiety (Adult)    Problem Identified: Anxiety Identification (Anxiety)     Problem Identified: Symptoms (Anxiety)     Goal: Anxiety Symptoms Monitored and Managed   Start Date: 11/07/2020  Expected End Date: 01/06/2021  Recent Progress: On track  Priority: High  Note:   Current Barriers:  Marland Kitchen Mental Health Concerns  . Challenges with Anxiety  Clinical Social Work Clinical Goal(s):  Marland Kitchen Over the next 90 days, patient will follow up with a mental health therapist as directed by SW  Interventions: . 1:1 collaboration with Trinna Post, PA-C regarding development and update of comprehensive plan of care as evidenced by provider attestation and co-signature . Inter-disciplinary care team collaboration (see longitudinal plan of care) . Patient interviewed and appropriate assessments performed . Follow up provided on  patient's challenges with anxiety and relationship concerns . Patient confirmed increased ability to set healthy boundaries in her relationships . Emotional support continued to be provided,  coping strategies explored, self care emphasized, ongoing mental health counseling encouraged   Patient Goals/Self-Care Activities Over the next 90 days, patient will:   - - begin personal counseling - talk about feelings with a friend, family or spiritual advisor - practice positive thinking and self-talk  Follow up Plan: SW will follow up with patient by phone over the next 14 business days    Task: Alleviate Barriers to Anxiety Treatment Success   Due Date: 01/06/2021  Priority: Routine  Note:   Care Management Activities:    - coping strategies encouraged - participation in counseling encouraged    Notes:    Problem Identified: Harm or Injury (Anxiety)       The patient verbalized understanding of instructions, educational materials, and care plan provided today and declined offer to receive copy of patient instructions,  educational materials, and care plan.   Telephone follow up appointment with care management team member scheduled for:  11/28/20    Elliot Gurney, [place] Worker  [facility] [phone]

## 2020-11-20 ENCOUNTER — Other Ambulatory Visit: Payer: 59

## 2020-11-20 ENCOUNTER — Other Ambulatory Visit: Payer: Self-pay

## 2020-11-20 DIAGNOSIS — E221 Hyperprolactinemia: Secondary | ICD-10-CM

## 2020-11-21 LAB — PROLACTIN: Prolactin: 1.4 ng/mL — ABNORMAL LOW (ref 4.8–23.3)

## 2020-11-21 NOTE — Progress Notes (Signed)
Her medication is working very well-may be a little too well.  Please contact her and have her take the medicine only 1 time per week instead of 2.  Repeat prolactin again in 4 weeks.

## 2020-11-28 ENCOUNTER — Telehealth: Payer: Self-pay | Admitting: *Deleted

## 2020-11-28 NOTE — Telephone Encounter (Addendum)
    Care Management   Unsuccessful Call Note 11/28/2020 Name: [name] MRN: [id number] DOB: Nov 23, 1980  Elizabeth Marks is a 40 year old  female who sees Creig Hines, [place]  for primary care. Carles Collet, PA-C asked the CCM team to consult the patient for Mental Health Counseling and Resources.    This social worker was unable to reach patient via telephone today for follow up call. NO HIPAA compliant voicemail left as voicemail was full. (unsuccessful outreach #1).   Plan: Will follow-up within 7-14 business days via telephone.      Elliot Gurney, [place] Worker  [facility] [phone]

## 2020-12-10 ENCOUNTER — Ambulatory Visit: Payer: 59 | Admitting: *Deleted

## 2020-12-10 DIAGNOSIS — F419 Anxiety disorder, unspecified: Secondary | ICD-10-CM

## 2020-12-10 NOTE — Chronic Care Management (AMB) (Signed)
Care Management Clinical Social Work Note  12/10/2020 Name: Elizabeth Marks MRN: [id number] DOB: [birth year]  Elizabeth Marks is a 40 y.o. year old female who is a primary care patient of Trinna Post, [place].  The Care Management team was consulted for assistance with chronic disease management and coordination needs.  Engaged with patient by telephone for follow up visit in response to provider referral for social work chronic care management and care coordination services  Assessment: Review of patient past medical history, allergies, medications, and health status, including review of relevant consultants reports was performed today as part of a comprehensive evaluation and provision of chronic care management and care coordination services.  SDOH (Social Determinants of Health) assessments and interventions performed: yes    Advanced Directives Status: Not addressed in this encounter.  Care Plan  Allergies  Allergen Reactions  . Corn-Containing Products     Abdominal pain  . Shellfish Allergy     Abdominal pain  . Adhesive [Tape] Rash  . Latex Rash    Outpatient Encounter Medications as of 12/10/2020  Medication Sig  . clindamycin (CLEOCIN T) 1 % lotion Apply 1 application topically daily.  Marland Kitchen escitalopram (LEXAPRO) 20 MG tablet Take 1 tablet (20 mg total) by mouth daily.  . fluticasone (FLONASE) 50 MCG/ACT nasal spray Place into the nose.  Marland Kitchen HYDROcodone-acetaminophen (NORCO/VICODIN) 5-325 MG tablet Take 1 tablet by mouth every 6 (six) hours as needed for moderate pain or severe pain. (Patient not taking: Reported on 10/16/2020)  . levonorgestrel (MIRENA) 20 MCG/24HR IUD 1 each by Intrauterine route once.  . ondansetron (ZOFRAN ODT) 4 MG disintegrating tablet Take 1 tablet (4 mg total) by mouth every 8 (eight) hours as needed for nausea or vomiting. (Patient not taking: Reported on 10/16/2020)  . Plecanatide (TRULANCE) 3 MG TABS Take 3 mg by mouth daily.  Marland Kitchen triamcinolone  cream (KENALOG) 0.1 % Apply 1 application topically 2 (two) times daily.   No facility-administered encounter medications on file as of 12/10/2020.    Patient Active Problem List   Diagnosis Date Noted  . Hydradenitis 08/08/2019  . Abdominal pain 07/28/2019  . 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 Galactorrhea 05/18/2019  . Anxiety 04/07/2019  . Bloating 04/07/2019    Conditions to be addressed/monitored: Depression; Mental Health Concerns   Care Plan : Anxiety (Adult)  Updates made by Vern Claude, LCSW since 12/10/2020 12:00 AM    Problem: Symptoms (Anxiety)     Goal: Anxiety Symptoms Monitored and Managed   Start Date: 11/07/2020  Expected End Date: 01/06/2021  Recent Progress: On track  Priority: High  Note:   Current Barriers:  Marland Kitchen Mental Health Concerns  . Challenges with Anxiety  Clinical Social Work Clinical Goal(s):  Marland Kitchen Over the next 90 days, patient will follow up with a mental health therapist as directed by SW  Interventions: . 1:1 collaboration with Trinna Post, PA-C regarding development and update of comprehensive plan of care as evidenced by provider attestation and co-signature . Inter-disciplinary care team collaboration (see longitudinal plan of care) . Patient interviewed and appropriate assessments performed . Follow up provided on  patient's challenges with anxiety and relationship concerns . Patient confirmed increased ability to set healthy boundaries in her relationships . Patient now looking for work, however short term as she now has plans to move to [place] in June  . Emotional  support continued to be provided,  coping strategies explored, self care emphasized, ongoing mental health counseling encouraged   Patient Goals/Self-Care Activities Over the next 90 days, patient will:   - - begin personal counseling - talk about feelings with a friend,  family or spiritual advisor - practice positive thinking and self-talk  Follow up Plan: SW will follow up with patient by phone over the next 14 business days       Follow Up Plan: SW will follow up with patient by phone over the next 14 business days   [employer], [place] Worker  [facility] [phone]

## 2020-12-10 NOTE — Patient Instructions (Signed)
Visit Information  PATIENT GOALS:  Goals Addressed            This Visit's Progress   . Manage My Emotions       Timeframe:  Short-Term Goal Priority:  Medium Start Date:     11/08/20                        Expected End Date:    01/06/21                   Follow Up Date 12/24/2020   - begin personal counseling when available - talk about feelings with a friend, family or spiritual advisor - practice positive thinking and self-talk    Why is this important?    When you are stressed, down or upset, your body reacts too.   For example, your blood pressure may get higher; you may have a headache or stomachache.   When your emotions get the best of you, your body's ability to fight off cold and flu gets weak.   These steps will help you manage your emotions.     Notes:        The patient verbalized understanding of instructions, educational materials, and care plan provided today and declined offer to receive copy of patient instructions, educational materials, and care plan.   Telephone follow up appointment with care management team member scheduled for:12/24/20   Elliot Gurney, [place] Worker  [facility] [phone]

## 2020-12-20 ENCOUNTER — Other Ambulatory Visit: Payer: 59

## 2020-12-24 ENCOUNTER — Ambulatory Visit: Payer: Self-pay | Admitting: *Deleted

## 2020-12-24 DIAGNOSIS — F419 Anxiety disorder, unspecified: Secondary | ICD-10-CM

## 2020-12-24 NOTE — Chronic Care Management (AMB) (Signed)
Care Management Clinical Social Work Note  12/24/2020 Name: Elizabeth Marks MRN: [id number] DOB: 1981/05/05  Elizabeth Marks is a 40 y.o. year old female who is a primary care patient of Trinna Post, [place].  The Care Management team was consulted for assistance with chronic disease management and coordination needs.  Engaged with patient by telephone for follow up visit in response to provider referral for social work chronic care management and care coordination services  Consent to Services: Assessment: Review of patient past medical history, allergies, medications, and health status, including review of relevant consultants reports was performed today as part of a comprehensive evaluation and provision of chronic care management and care coordination services.  SDOH (Social Determinants of Health) assessments and interventions performed:    Advanced Directives Status: Not addressed in this encounter.  Care Plan  Allergies  Allergen Reactions  . Corn-Containing Products     Abdominal pain  . Shellfish Allergy     Abdominal pain  . Adhesive [Tape] Rash  . Latex Rash    Outpatient Encounter Medications as of 12/24/2020  Medication Sig  . clindamycin (CLEOCIN T) 1 % lotion Apply 1 application topically daily.  Marland Kitchen escitalopram (LEXAPRO) 20 MG tablet Take 1 tablet (20 mg total) by mouth daily.  . fluticasone (FLONASE) 50 MCG/ACT nasal spray Place into the nose.  Marland Kitchen HYDROcodone-acetaminophen (NORCO/VICODIN) 5-325 MG tablet Take 1 tablet by mouth every 6 (six) hours as needed for moderate pain or severe pain. (Patient not taking: Reported on 10/16/2020)  . levonorgestrel (MIRENA) 20 MCG/24HR IUD 1 each by Intrauterine route once.  . ondansetron (ZOFRAN ODT) 4 MG disintegrating tablet Take 1 tablet (4 mg total) by mouth every 8 (eight) hours as needed for nausea or vomiting. (Patient not taking: Reported on 10/16/2020)  . Plecanatide (TRULANCE) 3 MG TABS Take 3 mg by mouth daily.   Marland Kitchen triamcinolone cream (KENALOG) 0.1 % Apply 1 application topically 2 (two) times daily.   No facility-administered encounter medications on file as of 12/24/2020.    Patient Active Problem List   Diagnosis Date Noted  . Hydradenitis 08/08/2019  . Abdominal pain 07/28/2019  . 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 Galactorrhea 05/18/2019  . Anxiety 04/07/2019  . Bloating 04/07/2019    Conditions to be addressed/monitored: Anxiety; Mental Health Concerns   Care Plan : Anxiety (Adult)  Updates made by Vern Claude, LCSW since 12/24/2020 12:00 AM    Problem: Symptoms (Anxiety)     Goal: Anxiety Symptoms Monitored and Managed   Start Date: 11/07/2020  Expected End Date: 01/06/2021  This Visit's Progress: On track  Recent Progress: On track  Priority: High  Note:   Current Barriers:  Marland Kitchen Mental Health Concerns  . Challenges with Anxiety  Clinical Social Work Clinical Goal(s):  Marland Kitchen Over the next 90 days, patient will follow up with a mental health therapist as directed by SW  Interventions: . 1:1 collaboration with Trinna Post, PA-C regarding development and update of comprehensive plan of care as evidenced by provider attestation and co-signature . Inter-disciplinary care team collaboration (see longitudinal plan of care) . Patient interviewed and appropriate assessments performed . Follow up provided on  patient's challenges with anxiety and relationship concerns . Patient confirmed improvement in mood, patient's family member actively in treatment . Patient confirmed increased ability to set healthy boundaries in her relationships . Patient now  looking for work, however short term as she continues with plans to move to [place] in June  . Emotional support continued to be provided,  coping strategies explored, self care emphasized, ongoing mental health counseling  encouraged   Patient Goals/Self-Care Activities Over the next 90 days, patient will:   - - begin personal counseling - talk about feelings with a friend, family or spiritual advisor - practice positive thinking and self-talk  Follow up Plan: SW will follow up with patient by phone over the next 14 business days       Follow Up Plan: SW will follow up with patient by phone over the next 14 business days   [employer], [place] Worker  [facility] [phone]

## 2020-12-24 NOTE — Patient Instructions (Signed)
Visit Information  PATIENT GOALS:  Goals Addressed            This Visit's Progress   . Manage My Emotions       Timeframe:  Short-Term Goal Priority:  Medium Start Date:     11/08/20                        Expected End Date:    01/06/21                   Follow Up Date 01/07/2021   - begin personal counseling when available - talk about feelings with a friend, family or spiritual advisor - practice positive thinking and self-talk  -follow up with referral to transitions therapeutic care    Why is this important?    When you are stressed, down or upset, your body reacts too.   For example, your blood pressure may get higher; you may have a headache or stomachache.   When your emotions get the best of you, your body's ability to fight off cold and flu gets weak.   These steps will help you manage your emotions.     Notes:       The patient verbalized understanding of instructions, educational materials, and care plan provided today and declined offer to receive copy of patient instructions, educational materials, and care plan.   Telephone follow up appointment with care management team member scheduled for:01/07/21   Elliot Gurney, [place] Worker  [facility] [phone]

## 2021-01-07 ENCOUNTER — Ambulatory Visit: Payer: Self-pay | Admitting: *Deleted

## 2021-01-07 DIAGNOSIS — F419 Anxiety disorder, unspecified: Secondary | ICD-10-CM

## 2021-01-07 NOTE — Chronic Care Management (AMB) (Signed)
Care Management Clinical Social Work Note  01/07/2021 Name: Elizabeth Marks MRN: [id number] DOB: 18-Jun-1981  Elizabeth Marks is a 40 y.o. year old female who is a primary care patient of Trinna Post, [place].  The Care Management team was consulted for assistance with chronic disease management and coordination needs.  Engaged with patient by telephone for follow up visit in response to provider referral for social work chronic care management and care coordination services Assessment: Review of patient past medical history, allergies, medications, and health status, including review of relevant consultants reports was performed today as part of a comprehensive evaluation and provision of chronic care management and care coordination services.  SDOH (Social Determinants of Health) assessments and interventions performed:    Advanced Directives Status: Not addressed in this encounter.  Care Plan  Allergies  Allergen Reactions  . Corn-Containing Products     Abdominal pain  . Shellfish Allergy     Abdominal pain  . Adhesive [Tape] Rash  . Latex Rash    Outpatient Encounter Medications as of 01/07/2021  Medication Sig  . clindamycin (CLEOCIN T) 1 % lotion Apply 1 application topically daily.  Marland Kitchen escitalopram (LEXAPRO) 20 MG tablet Take 1 tablet (20 mg total) by mouth daily.  . fluticasone (FLONASE) 50 MCG/ACT nasal spray Place into the nose.  Marland Kitchen HYDROcodone-acetaminophen (NORCO/VICODIN) 5-325 MG tablet Take 1 tablet by mouth every 6 (six) hours as needed for moderate pain or severe pain. (Patient not taking: Reported on 10/16/2020)  . levonorgestrel (MIRENA) 20 MCG/24HR IUD 1 each by Intrauterine route once.  . ondansetron (ZOFRAN ODT) 4 MG disintegrating tablet Take 1 tablet (4 mg total) by mouth every 8 (eight) hours as needed for nausea or vomiting. (Patient not taking: Reported on 10/16/2020)  . Plecanatide (TRULANCE) 3 MG TABS Take 3 mg by mouth daily.  Marland Kitchen triamcinolone cream  (KENALOG) 0.1 % Apply 1 application topically 2 (two) times daily.   No facility-administered encounter medications on file as of 01/07/2021.    Patient Active Problem List   Diagnosis Date Noted  . Hydradenitis 08/08/2019  . Abdominal pain 07/28/2019  . 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 Galactorrhea 05/18/2019  . Anxiety 04/07/2019  . Bloating 04/07/2019    Conditions to be addressed/monitored: Anxiety; Mental Health Concerns   Care Plan : Anxiety (Adult)  Updates made by Vern Claude, LCSW since 01/07/2021 12:00 AM    Problem: Symptoms (Anxiety)     Goal: Anxiety Symptoms Monitored and Managed   Start Date: 11/07/2020  Expected End Date: 04/08/2021  This Visit's Progress: On track  Recent Progress: On track  Priority: High  Note:   Current Barriers:  Marland Kitchen Mental Health Concerns  . Challenges with Anxiety  Clinical Social Work Clinical Goal(s):  Marland Kitchen Over the next 90 days, patient will follow up with a mental health therapist as directed by SW  Interventions: . 1:1 collaboration with Trinna Post, PA-C regarding development and update of comprehensive plan of care as evidenced by provider attestation and co-signature . Inter-disciplinary care team collaboration (see longitudinal plan of care) . Patient interviewed and appropriate assessments performed . Follow up provided on  patient's challenges with anxiety and relationship concerns . Patient confirmed improvement in mood, patient's family member continues to be actively in treatment . Patient confirmed continued ability to set healthy boundaries and expectations in her relationships  .  Patient continues with plans to move to [place] in June to assist with cousin who will be having a baby . Emotional support continued to be provided,  coping strategies explored, self care emphasized, ongoing mental health counseling  encouraged   Patient Goals/Self-Care Activities Over the next 90 days, patient will:   - - begin personal counseling - talk about feelings with a friend, family or spiritual advisor - practice positive thinking and self-talk  Follow up Plan: SW will follow up with patient by phone over the next 14 business days    Task: Alleviate Barriers to Anxiety Treatment Success   Due Date: 04/08/2021  Priority: Routine  Note:   Care Management Activities:    - coping strategies encouraged - participation in counseling encouraged    Notes:       Follow Up Plan: SW will follow up with patient by phone over the next 14 business days   [place], [place] Worker  [facility] [phone]

## 2021-01-07 NOTE — Patient Instructions (Signed)
Visit Information  Goals Addressed            This Visit's Progress   . Manage My Emotions       Timeframe:  Short-Term Goal Priority:  Medium Start Date:     11/08/20                        Expected End Date:    01/06/21                   Follow Up Date 01/21/2021   - begin personal counseling when available - talk about feelings with a friend, family or spiritual advisor - practice positive thinking and self-talk  -follow up with referral to transitions therapeutic care    Why is this important?    When you are stressed, down or upset, your body reacts too.   For example, your blood pressure may get higher; you may have a headache or stomachache.   When your emotions get the best of you, your body's ability to fight off cold and flu gets weak.   These steps will help you manage your emotions.     Notes:        The patient verbalized understanding of instructions, educational materials, and care plan provided today and declined offer to receive copy of patient instructions, educational materials, and care plan.   Telephone follow up appointment with care management team member scheduled for:01/21/21   Elliot Gurney, [place] Worker  [facility] [phone]

## 2021-01-16 ENCOUNTER — Encounter: Payer: 59 | Admitting: Obstetrics and Gynecology

## 2021-01-21 ENCOUNTER — Ambulatory Visit: Payer: Self-pay | Admitting: *Deleted

## 2021-01-21 DIAGNOSIS — F419 Anxiety disorder, unspecified: Secondary | ICD-10-CM

## 2021-01-21 DIAGNOSIS — R1084 Generalized abdominal pain: Secondary | ICD-10-CM

## 2021-01-21 NOTE — Chronic Care Management (AMB) (Signed)
Care Management Clinical Social Work Note  01/21/2021 Name: Elizabeth Marks MRN: [id number] DOB: 1981/10/29  [name] is a 40 y.o. year old female who is a primary care patient of Trinna Post, [place].  The Care Management team was consulted for assistance with chronic disease management and coordination needs.  Engaged with patient by telephone for follow up visit in response to provider referral for social work chronic care management and care coordination services  Assessment: Review of patient past medical history, allergies, medications, and health status, including review of relevant consultants reports was performed today as part of a comprehensive evaluation and provision of chronic care management and care coordination services.  SDOH (Social Determinants of Health) assessments and interventions performed:    Advanced Directives Status: Not addressed in this encounter.  Care Plan  Allergies  Allergen Reactions  . Corn-Containing Products     Abdominal pain  . Shellfish Allergy     Abdominal pain  . Adhesive [Tape] Rash  . Latex Rash    Outpatient Encounter Medications as of 01/21/2021  Medication Sig  . clindamycin (CLEOCIN T) 1 % lotion Apply 1 application topically daily.  Marland Kitchen escitalopram (LEXAPRO) 20 MG tablet Take 1 tablet (20 mg total) by mouth daily.  . fluticasone (FLONASE) 50 MCG/ACT nasal spray Place into the nose.  Marland Kitchen HYDROcodone-acetaminophen (NORCO/VICODIN) 5-325 MG tablet Take 1 tablet by mouth every 6 (six) hours as needed for moderate pain or severe pain. (Patient not taking: Reported on 10/16/2020)  . levonorgestrel (MIRENA) 20 MCG/24HR IUD 1 each by Intrauterine route once.  . ondansetron (ZOFRAN ODT) 4 MG disintegrating tablet Take 1 tablet (4 mg total) by mouth every 8 (eight) hours as needed for nausea or vomiting. (Patient not taking: Reported on 10/16/2020)  . Plecanatide (TRULANCE) 3 MG TABS Take 3 mg by mouth daily.  Marland Kitchen triamcinolone  cream (KENALOG) 0.1 % Apply 1 application topically 2 (two) times daily.   No facility-administered encounter medications on file as of 01/21/2021.    Patient Active Problem List   Diagnosis Date Noted  . Hydradenitis 08/08/2019  . Abdominal pain 07/28/2019  . 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 Galactorrhea 05/18/2019  . Anxiety 04/07/2019  . Bloating 04/07/2019    Conditions to be addressed/monitored:  Mental Health Concerns   Care Plan : Anxiety (Adult)  Updates made by Vern Claude, LCSW since 01/21/2021 12:00 AM    Problem: Symptoms (Anxiety)     Goal: Anxiety Symptoms Monitored and Managed   Start Date: 11/07/2020  Expected End Date: 04/08/2021  Recent Progress: On track  Priority: High  Note:   Current Barriers:  Marland Kitchen Mental Health Concerns  . Challenges with Anxiety  Clinical Social Work Clinical Goal(s):  Marland Kitchen Over the next 90 days, patient will follow up with a mental health therapist as directed by SW  Interventions: . 1:1 collaboration with Trinna Post, PA-C regarding development and update of comprehensive plan of care as evidenced by provider attestation and co-signature . Inter-disciplinary care team collaboration (see longitudinal plan of care) . Patient interviewed and appropriate assessments performed . Follow up provided on  patient's challenges with anxiety and relationship concerns . Patient confirmed improvement in anxiety symptoms, patient able to verbalize appropriate coping strategies . Patient confirmed continued ability to set healthy boundaries and expectations in her relationships  . Discussed increased ability to reflect, plan  and set new goals for her life . Emotional support continued to be provided,  coping strategies explored, self care emphasized, ongoing mental health counseling encouraged   Patient Goals/Self-Care Activities Over the  next 90 days, patient will:   - talk about feelings with a friend, family or spiritual advisor - practice positive thinking and self-talk  Follow up Plan: SW will follow up with patient by phone over the next 14 business days       Follow Up Plan: SW will follow up with patient by phone over the next 14 business days   [employer], [place] Worker  [facility] [phone]

## 2021-01-21 NOTE — Patient Instructions (Signed)
Visit Information  Goals Addressed            This Visit's Progress   . Manage My Emotions       Timeframe:  Short-Term Goal Priority:  Medium Start Date:     11/08/20                        Expected End Date:                   Follow Up Date 02/04/2021   - talk about feelings with a friend, family or spiritual advisor - practice positive thinking and self-talk  - continue to utilize positive coping strategies to manage anxiety   Why is this important?    When you are stressed, down or upset, your body reacts too.   For example, your blood pressure may get higher; you may have a headache or stomachache.   When your emotions get the best of you, your body's ability to fight off cold and flu gets weak.   These steps will help you manage your emotions.     Notes:        The patient verbalized understanding of instructions, educational materials, and care plan provided today and declined offer to receive copy of patient instructions, educational materials, and care plan.   Telephone follow up appointment with care management team member scheduled for:02/04/21    Elliot Gurney, [place] Worker  [facility] [phone]

## 2021-01-30 ENCOUNTER — Ambulatory Visit (INDEPENDENT_AMBULATORY_CARE_PROVIDER_SITE_OTHER): Payer: 59 | Admitting: Obstetrics and Gynecology

## 2021-01-30 ENCOUNTER — Other Ambulatory Visit (HOSPITAL_COMMUNITY)
Admission: RE | Admit: 2021-01-30 | Discharge: 2021-01-30 | Disposition: A | Discharge status: Home / Self Care | Payer: 59 | Source: Ambulatory Visit | Attending: Obstetrics and Gynecology | Admitting: Obstetrics and Gynecology

## 2021-01-30 ENCOUNTER — Other Ambulatory Visit: Payer: Self-pay

## 2021-01-30 ENCOUNTER — Encounter: Payer: Self-pay | Admitting: Obstetrics and Gynecology

## 2021-01-30 VITALS — BP 113/75 | HR 79 | Ht 63.5 in | Wt 187.4 lb

## 2021-01-30 DIAGNOSIS — Z8742 Personal history of other diseases of the female genital tract: Secondary | ICD-10-CM | POA: Insufficient documentation

## 2021-01-30 DIAGNOSIS — Z711 Person with feared health complaint in whom no diagnosis is made: Secondary | ICD-10-CM | POA: Diagnosis not present

## 2021-01-30 DIAGNOSIS — R7989 Other specified abnormal findings of blood chemistry: Secondary | ICD-10-CM

## 2021-01-30 DIAGNOSIS — E8809 Other disorders of plasma-protein metabolism, not elsewhere classified: Secondary | ICD-10-CM | POA: Diagnosis not present

## 2021-01-30 DIAGNOSIS — Z01419 Encounter for gynecological examination (general) (routine) without abnormal findings: Secondary | ICD-10-CM | POA: Diagnosis not present

## 2021-01-30 LAB — POCT URINALYSIS DIPSTICK
Bilirubin, UA: NEGATIVE
Glucose: NEGATIVE
Ketones, UA: NEGATIVE
Leukocytes, UA: NEGATIVE
Nitrite, UA: NEGATIVE
Odor: NEGATIVE
Protein, UA: NEGATIVE
Spec Grav, UA: 1.015 (ref 1.010–1.025)
Urobilinogen, UA: 0.2 E.U./dL
pH, UA: 7 (ref 5.0–8.0)

## 2021-01-30 MED ORDER — CABERGOLINE 0.5 MG PO TABS: 0.2500 mg | ORAL_TABLET | ORAL | 3 refills | Status: AC

## 2021-01-30 NOTE — Progress Notes (Signed)
HPI:      [name] is a 40 y.o. [id number] who LMP was Patient's last menstrual period was 12/30/2020.  Subjective:   She presents today for her annual examination.  In addition she has some concerns regarding a new sexual partner and would like to be tested for STDs.  She reports that she has no symptoms and she noticed of no symptoms that he has.  She just "wants to be sure". She is also taking the Dostinex 1 time per week and needs her follow-up prolactin checked. She had several questions about hidradenitis especially in her abdomen above her cesarean scar.  She has seen a dermatologist for this in the past and was prescribed clindamycin cream.  This cream seems to work in most places except for her abdomen. She has a remote family history of breast cancer (2 aunts) but no first-degree relatives.  She will be scheduled for her first mammogram. She has a history of abnormal Pap smear which showed some improvement over time.  Last year it was ASCUS.  She is known to be positive for HPV (not 1618)    Hx: The following portions of the patient's history were reviewed and updated as appropriate:             She  has a past medical history of Allergy and Anxiety (04/07/2019). She does not have any pertinent problems on file. She  has a past surgical history that includes Cesarean section; Tonsillectomy and adenoidectomy; Wisdom tooth extraction; LEEP (N/A, 08/25/2019); IUD removal (08/25/2019); and Intrauterine device (iud) insertion (08/25/2019). Her family history includes Aneurysm in her maternal aunt; Anxiety disorder in her son; Cancer in her father and sister; Diabetes in her mother; Fibroids in her mother; Kidney disease in her maternal aunt. She  reports that she has been smoking cigarettes. She has been smoking about 0.25 packs per day. She has never used smokeless tobacco. She reports current alcohol use of about 1.0 standard drink of alcohol per week. She reports current drug use.  Drugs: Other-see comments and Marijuana. She has a current medication list which includes the following prescription(s): clindamycin, escitalopram, fluticasone, levonorgestrel, triamcinolone, and cabergoline. She is allergic to corn-containing products, shellfish allergy, adhesive [tape], and latex.       Review of Systems:  Review of Systems  Constitutional: Denied constitutional symptoms, night sweats, recent illness, fatigue, fever, insomnia and weight loss.  Eyes: Denied eye symptoms, eye pain, photophobia, vision change and visual disturbance.  Ears/Nose/Throat/Neck: Denied ear, nose, throat or neck symptoms, hearing loss, nasal discharge, sinus congestion and sore throat.  Cardiovascular: Denied cardiovascular symptoms, arrhythmia, chest pain/pressure, edema, exercise intolerance, orthopnea and palpitations.  Respiratory: Denied pulmonary symptoms, asthma, pleuritic pain, productive sputum, cough, dyspnea and wheezing.  Gastrointestinal: Denied, gastro-esophageal reflux, melena, nausea and vomiting.  Genitourinary: Denied genitourinary symptoms including symptomatic vaginal discharge, pelvic relaxation issues, and urinary complaints.  Musculoskeletal: Denied musculoskeletal symptoms, stiffness, swelling, muscle weakness and myalgia.  Dermatologic: Denied dermatology symptoms, rash and scar.  Neurologic: Denied neurology symptoms, dizziness, headache, neck pain and syncope.  Psychiatric: Denied psychiatric symptoms, anxiety and depression.  Endocrine: Denied endocrine symptoms including hot flashes and night sweats.   Meds:   Current Outpatient Medications on File Prior to Visit  Medication Sig Dispense Refill  . clindamycin (CLEOCIN T) 1 % lotion Apply 1 application topically daily. 60 mL 0  . escitalopram (LEXAPRO) 20 MG tablet Take 1 tablet (20 mg total) by mouth daily. 90 tablet 3  .  fluticasone (FLONASE) 50 MCG/ACT nasal spray Place into the nose.    . levonorgestrel (MIRENA) 20  MCG/24HR IUD 1 each by Intrauterine route once.    . triamcinolone cream (KENALOG) 0.1 % Apply 1 application topically 2 (two) times daily. 30 g 0  . cabergoline (DOSTINEX) 0.5 MG tablet Take 0.25 mg by mouth 2 (two) times a week.     No current facility-administered medications on file prior to visit.       [facility] - 01/30/21 1114      Pregnancy Intention Screening   Does the patient want to become pregnant in the next year? No    Does the patient's partner want to become pregnant in the next year? No    Would the patient like to discuss contraceptive options today? No      Contraception Wrap Up   Current Method IUD or IUS          The pregnancy intention screening data noted above was reviewed. Potential methods of contraception were discussed. The patient elected to proceed with IUD or IUS.     Objective:     Vitals:   01/30/21 1107  BP: 113/75  Pulse: 79    Filed Weights   01/30/21 1107  Weight: 187 lb 6.4 oz (85 kg)              Physical examination General NAD, Conversant  HEENT Atraumatic; Op clear with mmm.  Normo-cephalic. Pupils reactive. Anicteric sclerae  Thyroid/Neck Smooth without nodularity or enlargement. Normal ROM.  Neck Supple.  Skin No rashes, lesions or ulceration. Normal palpated skin turgor. No nodularity.  Breasts: No masses or discharge.  Symmetric.  No axillary adenopathy.  Lungs: Clear to auscultation.No rales or wheezes. Normal Respiratory effort, no retractions.  Heart: NSR.  No murmurs or rubs appreciated. No periferal edema  Abdomen: Soft.  Non-tender.  No masses.  No HSM. No hernia  Extremities: Moves all appropriately.  Normal ROM for age. No lymphadenopathy.  Neuro: Oriented to PPT.  Normal mood. Normal affect.     Pelvic:   Vulva: Normal appearance.  No lesions.  Vagina: No lesions or abnormalities noted.  Support: Normal pelvic support.  Urethra No masses tenderness or scarring.  Meatus Normal size without lesions or prolapse.   Cervix: Normal appearance.  No lesions.  Anus: Normal exam.  No lesions.  Perineum: Normal exam.  No lesions.        Bimanual   Uterus: Normal size.  Non-tender.  Mobile.  AV.  Adnexae: No masses.  Non-tender to palpation.  Cul-de-sac: Negative for abnormality.      Assessment:    [id number] Patient Active Problem List   Diagnosis Date Noted  . Hydradenitis 08/08/2019  . Abdominal pain 07/28/2019  . 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 Galactorrhea 05/18/2019  . Anxiety 04/07/2019  . Bloating 04/07/2019     1. Well woman exam with routine gynecological exam   2. History of abnormal cervical Pap smear   3. Hyperproteinemia   4. Concern about STD in female without diagnosis        Plan:            1.  Basic Screening Recommendations The basic screening recommendations for asymptomatic women were discussed with the patient during her visit.  The age-appropriate recommendations were discussed with her and the rational for the tests reviewed.  When  I am informed by the patient that another primary care physician has previously obtained the age-appropriate tests and they are up-to-date, only outstanding tests are ordered and referrals given as necessary.  Abnormal results of tests will be discussed with her when all of her results are completed.  Routine preventative health maintenance measures emphasized: Exercise/Diet/Weight control, Tobacco Warnings, Alcohol/Substance use risks and Stress Management Pap performed -first mammogram ordered -patient will return for lab work when fasting. 2.  Urine sent for C&S because of small amount of hematuria 3.  Patient to see dermatologist for hidradenitis-she has established care there. 4.  Will return for fasting blood work including prolactin now that she has only taking Dostinex 1 time per week. 5.  Complete STD testing per patient  request. Orders Orders Placed This Encounter  Procedures  . Urine Culture  . MM 3D SCREEN BREAST BILATERAL  . Hepatitis B surface antigen  . Hepatitis C antibody  . RPR  . HSV(herpes simplex vrs) 1+2 ab-IgG  . HIV Antibody (routine testing w rflx)  . Prolactin  . CBC With Diff/Platelet  . Lipid panel  . TSH  . Hemoglobin A1c  . POCT urinalysis dipstick    No orders of the defined types were placed in this encounter.         F/U  No follow-ups on file. I spent 14 additional minutes involved in the care of this patient addressing her concerns regarding mammography and breast cancer, hyperprolactinemia, abnormal Pap smear, hidradenitis, and STDs. preparing to see the patient by obtaining and reviewing her medical history (including labs, imaging tests and prior procedures), documenting clinical information in the electronic health record (EHR), counseling and coordinating care plans, writing and sending prescriptions, ordering tests or procedures and directly communicating with the patient by discussing pertinent items from her history and physical exam as well as detailing my assessment and plan as noted above so that she has an informed understanding.  All of her questions were answered.  Finis Bud, M.D. 01/30/2021 11:51 AM

## 2021-02-01 LAB — URINE CULTURE

## 2021-02-03 ENCOUNTER — Other Ambulatory Visit: Payer: Self-pay

## 2021-02-03 ENCOUNTER — Other Ambulatory Visit: Payer: 59

## 2021-02-04 ENCOUNTER — Ambulatory Visit: Payer: 59 | Admitting: *Deleted

## 2021-02-04 DIAGNOSIS — F419 Anxiety disorder, unspecified: Secondary | ICD-10-CM

## 2021-02-04 LAB — CYTOLOGY - PAP
Chlamydia: NEGATIVE
Comment: NEGATIVE
Comment: NEGATIVE
Comment: NEGATIVE
Comment: NEGATIVE
Comment: NORMAL
HPV 16: NEGATIVE
HPV 18 / 45: NEGATIVE
High risk HPV: POSITIVE — AB
Neisseria Gonorrhea: NEGATIVE
Trichomonas: NEGATIVE

## 2021-02-04 LAB — PROLACTIN: Prolactin: 7 ng/mL (ref 4.8–23.3)

## 2021-02-05 NOTE — Chronic Care Management (AMB) (Signed)
Care Management Clinical Social Work Note  02/05/2021 Name: Elizabeth Marks MRN: [id number] DOB: May 25, 1981  Elizabeth Marks is a 40 y.o. year old female who is a primary care patient of Elizabeth Marks, [place].  The Care Management team was consulted for assistance with chronic disease management and coordination needs.  Engaged with patient by telephone for follow up visit in response to provider referral for social work chronic care management and care coordination services  Assessment: Review of patient past medical history, allergies, medications, and health status, including review of relevant consultants reports was performed today as part of a comprehensive evaluation and provision of chronic care management and care coordination services.  SDOH (Social Determinants of Health) assessments and interventions performed:    Advanced Directives Status: Not addressed in this encounter.  Care Plan  Allergies  Allergen Reactions  . Corn-Containing Products     Abdominal pain  . Shellfish Allergy     Abdominal pain  . Adhesive [Tape] Rash  . Latex Rash    Outpatient Encounter Medications as of 02/04/2021  Medication Sig  . cabergoline (DOSTINEX) 0.5 MG tablet Take 0.5 tablets (0.25 mg total) by mouth once a week.  . clindamycin (CLEOCIN T) 1 % lotion Apply 1 application topically daily.  Marland Kitchen escitalopram (LEXAPRO) 20 MG tablet Take 1 tablet (20 mg total) by mouth daily.  . fluticasone (FLONASE) 50 MCG/ACT nasal spray Place into the nose.  . levonorgestrel (MIRENA) 20 MCG/24HR IUD 1 each by Intrauterine route once.  . triamcinolone cream (KENALOG) 0.1 % Apply 1 application topically 2 (two) times daily.   No facility-administered encounter medications on file as of 02/04/2021.    Patient Active Problem List   Diagnosis Date Noted  . Hydradenitis 08/08/2019  . Abdominal pain 07/28/2019  . 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 Vaginal  low risk HPV DNA test positive 05/18/2019  . Galactorrhea 05/18/2019  . Anxiety 04/07/2019  . Bloating 04/07/2019    Conditions to be addressed/monitored:  Mental Health Concerns   Care Plan : Anxiety (Adult)  Updates made by Elizabeth Claude, LCSW since 02/05/2021 12:00 AM    Problem: Symptoms (Anxiety)     Goal: Anxiety Symptoms Monitored and Managed   Start Date: 11/07/2020  Expected End Date: 04/08/2021  Recent Progress: On track  Priority: High  Note:   Current Barriers:  Marland Kitchen Mental Health Concerns  . Challenges with Anxiety  Clinical Social Work Clinical Goal(s):  Marland Kitchen Over the next 90 days, patient will follow up with a mental health therapist as directed by SW  Interventions: . 1:1 collaboration with Elizabeth Post, PA-C regarding development and update of comprehensive plan of care as evidenced by provider attestation and co-signature . Inter-disciplinary care team collaboration (see longitudinal plan of care) . Patient interviewed and appropriate assessments performed . Follow up provided on  patient's challenges with anxiety and relationship concerns . Patient continues to confirm improvement in anxiety symptoms, patient able to verbalize appropriate coping strategies to address family challenges . Patient confirmed continued ability to set healthy boundaries and expectations in her relationships  . Discussed continued ability to reflect, plan and set new goals for her life . Emotional support continued to be provided,  coping strategies explored, self care emphasized, ongoing mental health counseling encouraged   Patient Goals/Self-Care Activities Over the next 90 days, patient will:   - talk about feelings with a friend, family or spiritual advisor -  practice positive thinking and self-talk  Follow up Plan: SW will follow up with patient by phone over the next 14 business days       Follow Up Plan: SW will follow up with patient by phone over the next 30 business  days   [place], [place] Worker  [facility] Care Management [phone]

## 2021-02-05 NOTE — Patient Instructions (Signed)
Visit Information  Goals Addressed            This Visit's Progress   . Manage My Emotions       Timeframe:  Short-Term Goal Priority:  Medium Start Date:     11/08/20                        Expected End Date:                   Follow Up Date 03/06/2021   - talk about feelings with a friend, family or spiritual advisor - practice positive thinking and self-talk  - continue to utilize positive coping strategies to manage anxiety   Why is this important?    When you are stressed, down or upset, your body reacts too.   For example, your blood pressure may get higher; you may have a headache or stomachache.   When your emotions get the best of you, your body's ability to fight off cold and flu gets weak.   These steps will help you manage your emotions.     Notes:        The patient verbalized understanding of instructions, educational materials, and care plan provided today and declined offer to receive copy of patient instructions, educational materials, and care plan.   Telephone follow up appointment with care management team member scheduled for:03/06/21   Elliot Gurney, [place] Worker  [facility] [phone]

## 2021-02-06 ENCOUNTER — Encounter: Payer: Self-pay | Admitting: Obstetrics and Gynecology

## 2021-02-20 ENCOUNTER — Encounter: Payer: 59 | Admitting: Obstetrics and Gynecology

## 2021-03-06 ENCOUNTER — Telehealth: Payer: 59 | Admitting: *Deleted

## 2021-03-06 ENCOUNTER — Telehealth: Payer: Self-pay | Admitting: *Deleted

## 2021-03-06 NOTE — Telephone Encounter (Signed)
  Chronic Care Management   Outreach Note  03/06/2021 Name: ZYON GROUT MRN: [id number] DOB: 03/22/1981  Referred by: Trinna Post, PA-C Reason for referral : Care Coordination   An unsuccessful telephone outreach was attempted today. The patient was referred to the case management team for assistance with care management and care coordination.   Follow Up Plan: Telephone follow up appointment with care management team member scheduled for:03/06/21   Elliot Gurney, [place] Worker  [facility] [phone]

## 2021-03-10 ENCOUNTER — Encounter: Payer: Self-pay | Admitting: Obstetrics and Gynecology

## 2021-03-13 ENCOUNTER — Ambulatory Visit: Payer: 59 | Admitting: *Deleted

## 2021-03-13 ENCOUNTER — Encounter: Payer: 59 | Admitting: Obstetrics and Gynecology

## 2021-03-13 DIAGNOSIS — R1084 Generalized abdominal pain: Secondary | ICD-10-CM

## 2021-03-13 DIAGNOSIS — F419 Anxiety disorder, unspecified: Secondary | ICD-10-CM

## 2021-03-14 NOTE — Patient Instructions (Signed)
Visit Information  Goals Addressed            This Visit's Progress   . Manage My Emotions       Timeframe:  Short-Term Goal Priority:  Medium Start Date:     11/08/20                        Expected End Date:                   Follow Up Date 03/27/2021   - talk about feelings with a friend, family or spiritual advisor - practice positive thinking and self-talk  - continue to utilize positive coping strategies to manage anxiety   Why is this important?    When you are stressed, down or upset, your body reacts too.   For example, your blood pressure may get higher; you may have a headache or stomachache.   When your emotions get the best of you, your body's ability to fight off cold and flu gets weak.   These steps will help you manage your emotions.     Notes:        The patient verbalized understanding of instructions, educational materials, and care plan provided today and declined offer to receive copy of patient instructions, educational materials, and care plan.   Telephone follow up appointment with care management team member scheduled for: 03/27/21   Elliot Gurney, [place] Worker  [facility] [phone]

## 2021-03-14 NOTE — Chronic Care Management (AMB) (Signed)
Care Management Clinical Social Work Note  03/14/2021 Name: AMBERMARIE HONEYMAN MRN: [id number] DOB: November 29, 1980  [name] is a 40 y.o. year old female who is a primary care patient of Trinna Post, [place].  The Care Management team was consulted for assistance with chronic disease management and coordination needs.  Engaged with patient by telephone for follow up visit in response to provider referral for social work chronic care management and care coordination services  Assessment: Review of patient past medical history, allergies, medications, and health status, including review of relevant consultants reports was performed today as part of a comprehensive evaluation and provision of chronic care management and care coordination services.  SDOH (Social Determinants of Health) assessments and interventions performed:    Advanced Directives Status: Not addressed in this encounter.  Care Plan  Allergies  Allergen Reactions  . Corn-Containing Products     Abdominal pain  . Shellfish Allergy     Abdominal pain  . Adhesive [Tape] Rash  . Latex Rash    Outpatient Encounter Medications as of 03/13/2021  Medication Sig  . cabergoline (DOSTINEX) 0.5 MG tablet Take 0.5 tablets (0.25 mg total) by mouth once a week.  . clindamycin (CLEOCIN T) 1 % lotion Apply 1 application topically daily.  Marland Kitchen escitalopram (LEXAPRO) 20 MG tablet Take 1 tablet (20 mg total) by mouth daily.  . fluticasone (FLONASE) 50 MCG/ACT nasal spray Place into the nose.  . levonorgestrel (MIRENA) 20 MCG/24HR IUD 1 each by Intrauterine route once.  . triamcinolone cream (KENALOG) 0.1 % Apply 1 application topically 2 (two) times daily.   No facility-administered encounter medications on file as of 03/13/2021.    Patient Active Problem List   Diagnosis Date Noted  . Hydradenitis 08/08/2019  . Abdominal pain 07/28/2019  . 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 Galactorrhea 05/18/2019  . Anxiety 04/07/2019  . Bloating 04/07/2019    Conditions to be addressed/monitored: Anxiety; Mental Health Concerns   Care Plan : Anxiety (Adult)  Updates made by Vern Claude, LCSW since 03/14/2021 12:00 AM    Problem: Symptoms (Anxiety)     Goal: Anxiety Symptoms Monitored and Managed   Start Date: 11/07/2020  Expected End Date: 04/08/2021  This Visit's Progress: On track  Recent Progress: On track  Priority: High  Note:   Current Barriers:  Marland Kitchen Mental Health Concerns  . Challenges with Anxiety  Clinical Social Work Clinical Goal(s):  Marland Kitchen Over the next 90 days, patient will follow up with a mental health therapist as directed by SW  Interventions: . 1:1 collaboration with Trinna Post, PA-C regarding development and update of comprehensive plan of care as evidenced by provider attestation and co-signature . Inter-disciplinary care team collaboration (see longitudinal plan of care) . Patient interviewed and appropriate assessments performed . Follow up provided on  patient's challenges with anxiety and relationship concerns . Patient continues to confirm improvement in anxiety symptoms, patient able to verbalize appropriate coping strategies to address family challenges . Patient continues to make plans to move temporarily out of state and acknowledged need to make some decisions related to her current relationships . Patient confirmed continued ability to set healthy boundaries and expectations in her relationships  . Emotional support continued to be provided,  coping strategies explored, self care emphasized, ongoing mental health counseling encouraged   Patient Goals/Self-Care Activities Over the next 90 days,  patient will:   - talk about feelings with a friend, family or spiritual advisor - practice positive thinking and self-talk  Follow up Plan: SW will follow up with patient by phone  over the next 14 business days       Follow Up Plan: SW will follow up with patient by phone over the next 14 business days   [place], [place] Worker  [facility] [phone]

## 2021-03-26 ENCOUNTER — Encounter: Payer: 59 | Admitting: Obstetrics and Gynecology

## 2021-03-27 ENCOUNTER — Ambulatory Visit: Payer: 59 | Admitting: *Deleted

## 2021-03-27 DIAGNOSIS — F419 Anxiety disorder, unspecified: Secondary | ICD-10-CM

## 2021-03-27 NOTE — Patient Instructions (Signed)
Visit Information  Goals Addressed            This Visit's Progress   . Manage My Emotions       Timeframe:  Short-Term Goal Priority:  Medium Start Date:     11/08/20                        Expected End Date:  05/08/21                 Follow Up Date 06/092022   - talk about feelings with a friend, family or spiritual advisor - practice positive thinking and self-talk  - continue to utilize positive coping strategies to manage anxiety  Why is this important?    When you are stressed, down or upset, your body reacts too.   For example, your blood pressure may get higher; you may have a headache or stomachache.   When your emotions get the best of you, your body's ability to fight off cold and flu gets weak.   These steps will help you manage your emotions.     Notes:        The patient verbalized understanding of instructions, educational materials, and care plan provided today and declined offer to receive copy of patient instructions, educational materials, and care plan.   Telephone follow up appointment with care management team member scheduled for: 04/10/21   Elliot Gurney, [place] Worker  [facility] [phone]

## 2021-03-27 NOTE — Chronic Care Management (AMB) (Signed)
Care Management Clinical Social Work Note  03/27/2021 Name: NAYELLIE SANSEVERINO MRN: [id number] DOB: Dec 16, 1980  [name] is a 40 y.o. year old female who is a primary care patient of Trinna Post, [place].  The Care Management team was consulted for assistance with chronic disease management and coordination needs.  Engaged with patient by telephone for follow up visit in response to provider referral for social work chronic care management and care coordination services   Assessment: Review of patient past medical history, allergies, medications, and health status, including review of relevant consultants reports was performed today as part of a comprehensive evaluation and provision of chronic care management and care coordination services.  SDOH (Social Determinants of Health) assessments and interventions performed:    Advanced Directives Status: Not addressed in this encounter.  Care Plan  Allergies  Allergen Reactions  . Corn-Containing Products     Abdominal pain  . Shellfish Allergy     Abdominal pain  . Adhesive [Tape] Rash  . Latex Rash    Outpatient Encounter Medications as of 03/27/2021  Medication Sig  . cabergoline (DOSTINEX) 0.5 MG tablet Take 0.5 tablets (0.25 mg total) by mouth once a week.  . clindamycin (CLEOCIN T) 1 % lotion Apply 1 application topically daily.  Marland Kitchen escitalopram (LEXAPRO) 20 MG tablet Take 1 tablet (20 mg total) by mouth daily.  . fluticasone (FLONASE) 50 MCG/ACT nasal spray Place into the nose.  . levonorgestrel (MIRENA) 20 MCG/24HR IUD 1 each by Intrauterine route once.  . triamcinolone cream (KENALOG) 0.1 % Apply 1 application topically 2 (two) times daily.   No facility-administered encounter medications on file as of 03/27/2021.    Patient Active Problem List   Diagnosis Date Noted  . Hydradenitis 08/08/2019  . Abdominal pain 07/28/2019  . 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 Galactorrhea 05/18/2019  . Anxiety 04/07/2019  . Bloating 04/07/2019    Conditions to be addressed/monitored: Anxiety; Mental Health Concerns   Care Plan : Anxiety (Adult)  Updates made by Vern Claude, LCSW since 03/27/2021 12:00 AM    Problem: Symptoms (Anxiety)     Goal: Anxiety Symptoms Monitored and Managed   Start Date: 11/07/2020  Expected End Date: 04/08/2021  This Visit's Progress: On track  Recent Progress: On track  Priority: High  Note:   Current Barriers:  Marland Kitchen Mental Health Concerns  . Challenges with Anxiety  Clinical Social Work Clinical Goal(s):  Marland Kitchen Over the next 90 days, patient will follow up with a mental health therapist as directed by SW  Interventions: . 1:1 collaboration with Trinna Post, PA-C regarding development and update of comprehensive plan of care as evidenced by provider attestation and co-signature . Inter-disciplinary care team collaboration (see longitudinal plan of care) . Follow up provided on  patient's challenges with anxiety and relationship concerns . Patient continues to confirm improvement in anxiety symptoms, patient able to verbalize appropriate coping strategies to address family challenges . 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 Patient confirmed continued efforts to set healthy boundaries and expectations in her relationships  . Emotional support continued to be provided,  coping strategies explored, self care emphasized, ongoing mental health counseling encouraged   Patient Goals/Self-Care Activities Over the next 90 days, patient  will:   - talk about feelings with a friend, family or spiritual advisor - practice positive thinking and self-talk  Follow up Plan: SW will follow up with patient by phone over the next 14 business days        Follow Up Plan: SW will follow up with patient by phone over the next 14 buisness days   Elliot Gurney, [place] Worker  [facility] Care Management [phone]

## 2021-04-10 ENCOUNTER — Ambulatory Visit: Payer: 59 | Admitting: *Deleted

## 2021-04-10 DIAGNOSIS — F419 Anxiety disorder, unspecified: Secondary | ICD-10-CM

## 2021-04-11 NOTE — Patient Instructions (Signed)
Visit Information   Goals Addressed             This Visit's Progress    Manage My Emotions       Timeframe:  Short-Term Goal Priority:  Medium Start Date:     11/08/20                        Expected End Date:  05/08/21                 Follow Up Date 04/24/2021   - talk about feelings with a friend, family or spiritual advisor - practice positive thinking and self-talk  - continue to utilize positive coping strategies to manage anxiety  Why is this important?   When you are stressed, down or upset, your body reacts too.  For example, your blood pressure may get higher; you may have a headache or stomachache.  When your emotions get the best of you, your body's ability to fight off cold and flu gets weak.  These steps will help you manage your emotions.     Notes:          The patient verbalized understanding of instructions, educational materials, and care plan provided today and declined offer to receive copy of patient instructions, educational materials, and care plan.   Telephone follow up appointment with care management team member scheduled for: 04/24/21   Elliot Gurney, [place] Worker  [facility] [phone]

## 2021-04-11 NOTE — Chronic Care Management (AMB) (Signed)
Care Management Clinical Social Work Note  04/11/2021 Name: Elizabeth Marks MRN: [id number] DOB: [birth year]  Elizabeth Marks is a 40 y.o. year old female who is a primary care patient of Trinna Post, [place].  The Care Management team was consulted for assistance with chronic disease management and coordination needs.  Engaged with patient by telephone for follow up visit in response to provider referral for social work chronic care management and care coordination services  Assessment: Review of patient past medical history, allergies, medications, and health status, including review of relevant consultants reports was performed today as part of a comprehensive evaluation and provision of chronic care management and care coordination services.  SDOH (Social Determinants of Health) assessments and interventions performed:    Advanced Directives Status: Not addressed in this encounter.  Care Plan  Allergies  Allergen Reactions   Corn-Containing Products     Abdominal pain   Shellfish Allergy     Abdominal pain   Adhesive [Tape] Rash   Latex Rash    Outpatient Encounter Medications as of 04/10/2021  Medication Sig   cabergoline (DOSTINEX) 0.5 MG tablet Take 0.5 tablets (0.25 mg total) by mouth once a week.   clindamycin (CLEOCIN T) 1 % lotion Apply 1 application topically daily.   escitalopram (LEXAPRO) 20 MG tablet Take 1 tablet (20 mg total) by mouth daily.   fluticasone (FLONASE) 50 MCG/ACT nasal spray Place into the nose.   levonorgestrel (MIRENA) 20 MCG/24HR IUD 1 each by Intrauterine route once.   triamcinolone cream (KENALOG) 0.1 % Apply 1 application topically 2 (two) times daily.   No facility-administered encounter medications on file as of 04/10/2021.    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   Vaginal low risk HPV  DNA test positive 05/18/2019   Galactorrhea 05/18/2019   Anxiety 04/07/2019   Bloating 04/07/2019    Conditions to be addressed/monitored: Anxiety; Mental Health Concerns   Care Plan : Anxiety (Adult)  Updates made by Vern Claude, LCSW since 04/11/2021 12:00 AM     Problem: Symptoms (Anxiety)      Goal: Anxiety Symptoms Monitored and Managed   Start Date: 11/07/2020  Expected End Date: 04/08/2021  This Visit's Progress: On track  Recent Progress: On track  Priority: High  Note:   Current Barriers:  Mental Health Concerns  Challenges with Anxiety  Clinical Social Work Clinical Goal(s):  Over the next 90 days, patient will follow up with a mental health therapist as directed by SW  Interventions: 1:1 collaboration with Trinna Post, PA-C regarding development and update of comprehensive plan of care as evidenced by provider attestation and co-signature Inter-disciplinary care team collaboration (see longitudinal plan of care) Follow up provided on  patient's challenges with anxiety and relationship concerns Patient able to openly discuss challenges while identifying appropriate self management strategies.  Patient continues to make plans to move temporarily out of state (04/13/21) and continues to work on decisions related to how the move her current relationships Patient confirmed continued efforts to set healthy boundaries and expectations in her relationships  Emotional support continued to be provided,  coping strategies explored, self care emphasized, ongoing mental health counseling encouraged   Patient Goals/Self-Care Activities Over the next 90 days, patient will:   - talk about feelings with a friend, family or spiritual advisor - practice positive  thinking and self-talk  Follow up Plan: SW will follow up with patient by phone over the next 14 business days     Task: Alleviate Barriers to Anxiety Treatment Success   Due Date: 04/22/2021  Priority: Routine   Note:   Care Management Activities:    - coping strategies encouraged - participation in counseling encouraged    Notes:       Follow Up Plan: SW will follow up with patient by phone over the next 7-14 business days    [place], [place] Worker  [facility] [phone]

## 2021-04-23 ENCOUNTER — Telehealth: Payer: Self-pay | Admitting: *Deleted

## 2021-04-23 ENCOUNTER — Telehealth: Payer: 59 | Admitting: *Deleted

## 2021-04-23 NOTE — Telephone Encounter (Signed)
  Care Management   Follow Up Note   04/23/2021 Name: Elizabeth Marks MRN: [id number] DOB: 1981/09/19   Referred by: Trinna Post, PA-C Reason for referral : Care Coordination   An unsuccessful telephone outreach was attempted today. The patient was referred to the case management team for assistance with care management and care coordination.   Follow Up Plan: Telephone follow up appointment with care management team member scheduled for: 04/30/21     Elliot Gurney, [place] Worker  [facility] [phone]

## 2021-04-24 ENCOUNTER — Telehealth: Payer: 59

## 2021-04-30 ENCOUNTER — Telehealth: Payer: Self-pay | Admitting: *Deleted

## 2021-04-30 ENCOUNTER — Encounter: Payer: Self-pay | Admitting: *Deleted

## 2021-04-30 ENCOUNTER — Ambulatory Visit: Payer: 59 | Admitting: *Deleted

## 2021-04-30 DIAGNOSIS — F419 Anxiety disorder, unspecified: Secondary | ICD-10-CM

## 2021-04-30 NOTE — Patient Instructions (Signed)
Visit Information   Goals Addressed             This Visit's Progress    Manage My Emotions       Timeframe:  Short-Term Goal Priority:  Medium Start Date:     11/08/20                        Expected End Date:  05/08/21                 Follow Up Date 05/10/2021   - talk about feelings with a friend, family or spiritual advisor - practice positive thinking and self-talk  - continue to utilize positive coping strategies to manage anxiety  Why is this important?   When you are stressed, down or upset, your body reacts too.  For example, your blood pressure may get higher; you may have a headache or stomachache.  When your emotions get the best of you, your body's ability to fight off cold and flu gets weak.  These steps will help you manage your emotions.     Notes:          The patient verbalized understanding of instructions, educational materials, and care plan provided today and declined offer to receive copy of patient instructions, educational materials, and care plan.   Telephone follow up appointment with care management team member scheduled for:05/10/21   Elliot Gurney, [place] Worker  [facility] [phone]

## 2021-04-30 NOTE — Telephone Encounter (Signed)
  Care Management   Follow Up Note   04/30/2021 Name: AMEILIA RATTAN MRN: [id number] DOB: [birth year]   Referred by: Trinna Post, PA-C Reason for referral : Care Coordination   A second unsuccessful telephone outreach was attempted today. The patient was referred to the case management team for assistance with care management and care coordination.   Follow Up Plan: Telephone follow up appointment with care management team member scheduled for:05/07/21   Elliot Gurney, [place] Worker  [facility] [phone]

## 2021-04-30 NOTE — Chronic Care Management (AMB) (Signed)
Care Management Clinical Social Work Note  04/30/2021 Name: Elizabeth Marks MRN: [id number] DOB: 1981-06-15  Elizabeth Marks is a 40 y.o. year old female who is a primary care patient of Trinna Post, [place].  The Care Management team was consulted for assistance with chronic disease management and coordination needs.  Engaged with patient by telephone for follow up visit in response to provider referral for social work chronic care management and care coordination services  Consent to Services:  Elizabeth Marks was given information about Care Management services today including:  Care Management services includes personalized support from designated clinical staff supervised by her physician, including individualized plan of care and coordination with other care providers 24/7 contact phone numbers for assistance for urgent and routine care needs. The patient may stop case management services at any time by phone call to the office staff.  Patient agreed to services and consent obtained.   Assessment: Review of patient past medical history, allergies, medications, and health status, including review of relevant consultants reports was performed today as part of a comprehensive evaluation and provision of chronic care management and care coordination services.  SDOH (Social Determinants of Health) assessments and interventions performed:    Advanced Directives Status: Not addressed in this encounter.  Care Plan  Allergies  Allergen Reactions   Corn-Containing Products     Abdominal pain   Shellfish Allergy     Abdominal pain   Adhesive [Tape] Rash   Latex Rash    Outpatient Encounter Medications as of 04/30/2021  Medication Sig   cabergoline (DOSTINEX) 0.5 MG tablet Take 0.5 tablets (0.25 mg total) by mouth once a week.   clindamycin (CLEOCIN T) 1 % lotion Apply 1 application topically daily.   escitalopram (LEXAPRO) 20 MG tablet Take 1 tablet (20 mg total) by mouth daily.    fluticasone (FLONASE) 50 MCG/ACT nasal spray Place into the nose.   levonorgestrel (MIRENA) 20 MCG/24HR IUD 1 each by Intrauterine route once.   triamcinolone cream (KENALOG) 0.1 % Apply 1 application topically 2 (two) times daily.   No facility-administered encounter medications on file as of 04/30/2021.    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   Vaginal low risk HPV DNA test positive 05/18/2019   Galactorrhea 05/18/2019   Anxiety 04/07/2019   Bloating 04/07/2019    Conditions to be addressed/monitored: Anxiety; Mental Health Concerns   Care Plan : Anxiety (Adult)  Updates made by Vern Claude, LCSW since 04/30/2021 12:00 AM     Problem: Symptoms (Anxiety)      Goal: Anxiety Symptoms Monitored and Managed   Start Date: 11/07/2020  Expected End Date: 04/08/2021  Recent Progress: On track  Priority: High  Note:   Current Barriers:  Mental Health Concerns  Challenges with Anxiety  Clinical Social Work Clinical Goal(s):  Over the next 90 days, patient will follow up with a mental health therapist as directed by SW  Interventions: 1:1 collaboration with Trinna Post, PA-C regarding development and update of comprehensive plan of care as evidenced by provider attestation and co-signature Inter-disciplinary care team collaboration (see longitudinal plan of care) Follow up provided on  patient's challenges with anxiety and relationship concerns Patient's feelings processed regarding difficult decisions, self management skills explored Patient continues to be able to openly discuss challenges, grief responses and disappointments while identifying  appropriate self management strategies.  Patient continues to make plans to move temporarily out of state, however plans have been delayed to July 2022 Patient confirmed continued  efforts to set healthy boundaries and expectations in her relationships  Emotional support continued to be provided,  coping strategies explored, self care emphasized, ongoing mental health counseling encouraged   Patient Goals/Self-Care Activities Over the next 90 days, patient will:   - talk about feelings with a friend, family or spiritual advisor - practice positive thinking and self-talk  Follow up Plan: SW will follow up with patient by phone over the next 14 business days     Task: Alleviate Barriers to Anxiety Treatment Success   Due Date: 04/22/2021  Priority: Routine       Follow Up Plan: SW will follow up with patient by phone over the next 7-14 business days   Elliot Gurney, [place] Worker  [facility] [phone]

## 2021-05-01 NOTE — Telephone Encounter (Signed)
This encounter was created in error - please disregard.

## 2021-05-02 ENCOUNTER — Encounter: Payer: 59 | Admitting: Obstetrics and Gynecology

## 2021-05-07 ENCOUNTER — Ambulatory Visit: Payer: 59 | Admitting: *Deleted

## 2021-05-07 DIAGNOSIS — F419 Anxiety disorder, unspecified: Secondary | ICD-10-CM

## 2021-05-07 NOTE — Patient Instructions (Signed)
Visit Information   Goals Addressed             This Visit's Progress    Manage My Emotions       Timeframe:  Short-Term Goal Priority:  Medium Start Date:     11/08/20                        Expected End Date:  05/08/21                 Follow Up Date 06/11/2021   - talk about feelings with a friend, family or spiritual advisor - practice positive thinking and self-talk  - continue to utilize positive coping strategies to manage anxiety  Why is this important?   When you are stressed, down or upset, your body reacts too.  For example, your blood pressure may get higher; you may have a headache or stomachache.  When your emotions get the best of you, your body's ability to fight off cold and flu gets weak.  These steps will help you manage your emotions.     Notes:          The patient verbalized understanding of instructions, educational materials, and care plan provided today and declined offer to receive copy of patient instructions, educational materials, and care plan.   Telephone follow up appointment with care management team member scheduled for: 06/11/21   Elliot Gurney, [place] Worker  [facility] [phone]

## 2021-05-07 NOTE — Chronic Care Management (AMB) (Signed)
\Care Management Clinical Social Work Note  05/07/2021 Name: ALZINA GOLDA MRN: [id number] DOB: 1980-11-10  Elizabeth Marks is a 40 y.o. year old female who is a primary care patient of Trinna Post, [place].  The Care Management team was consulted for assistance with chronic disease management and coordination needs.  Engaged with patient by telephone for follow up visit in response to provider referral for social work chronic care management and care coordination services  Assessment: Review of patient past medical history, allergies, medications, and health status, including review of relevant consultants reports was performed today as part of a comprehensive evaluation and provision of chronic care management and care coordination services.  SDOH (Social Determinants of Health) assessments and interventions performed:    Advanced Directives Status: Not addressed in this encounter.  Care Plan  Allergies  Allergen Reactions   Corn-Containing Products     Abdominal pain   Shellfish Allergy     Abdominal pain   Adhesive [Tape] Rash   Latex Rash    Outpatient Encounter Medications as of 05/07/2021  Medication Sig   cabergoline (DOSTINEX) 0.5 MG tablet Take 0.5 tablets (0.25 mg total) by mouth once a week.   clindamycin (CLEOCIN T) 1 % lotion Apply 1 application topically daily.   escitalopram (LEXAPRO) 20 MG tablet Take 1 tablet (20 mg total) by mouth daily.   fluticasone (FLONASE) 50 MCG/ACT nasal spray Place into the nose.   levonorgestrel (MIRENA) 20 MCG/24HR IUD 1 each by Intrauterine route once.   triamcinolone cream (KENALOG) 0.1 % Apply 1 application topically 2 (two) times daily.   No facility-administered encounter medications on file as of 05/07/2021.    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   Vaginal low risk HPV  DNA test positive 05/18/2019   Galactorrhea 05/18/2019   Anxiety 04/07/2019   Bloating 04/07/2019    Conditions to be addressed/monitored: Anxiety; Mental Health Concerns   Care Plan : Anxiety (Adult)  Updates made by Vern Claude, LCSW since 05/07/2021 12:00 AM     Problem: Symptoms (Anxiety)      Goal: Anxiety Symptoms Monitored and Managed   Start Date: 11/07/2020  Expected End Date: 06/11/2021  This Visit's Progress: On track  Recent Progress: On track  Priority: High  Note:   Current Barriers:  Mental Health Concerns  Challenges with Anxiety  Clinical Social Work Clinical Goal(s):  Over the next 90 days, patient will follow up with a mental health therapist as directed by SW  Interventions: 1:1 collaboration with Trinna Post, PA-C regarding development and update of comprehensive plan of care as evidenced by provider attestation and co-signature Inter-disciplinary care team collaboration (see longitudinal plan of care) Follow up provided regarding  patient's challenges with anxiety and relationship concerns Patient's feelings processed regarding difficult decisions, self management skills explored Patient continues to be consistent with her decision regarding her relationship and decision to travel out of state for work  Patient confirmed continued efforts to set healthy boundaries and expectations in her relationships  Emotional support continued to be provided,  coping strategies explored, self care emphasized, ongoing mental health counseling encouraged   Patient Goals/Self-Care Activities Over the next 90 days, patient will:   - talk about feelings with a friend, family or spiritual advisor - practice positive thinking and self-talk  Follow up Plan: SW  will follow up with patient by phone over the next 14 business days     Task: Alleviate Barriers to Anxiety Treatment Success   Priority: Routine       Follow Up Plan: SW will follow up with patient by  phone over the next 30 days   [place], [place] Worker  [facility] [phone]

## 2021-05-15 ENCOUNTER — Other Ambulatory Visit: Payer: Self-pay

## 2021-05-15 ENCOUNTER — Other Ambulatory Visit (HOSPITAL_COMMUNITY)
Admission: RE | Admit: 2021-05-15 | Discharge: 2021-05-15 | Disposition: A | Discharge status: Home / Self Care | Payer: 59 | Source: Ambulatory Visit | Attending: Obstetrics and Gynecology | Admitting: Obstetrics and Gynecology

## 2021-05-15 ENCOUNTER — Encounter: Payer: Self-pay | Admitting: Obstetrics and Gynecology

## 2021-05-15 ENCOUNTER — Ambulatory Visit (INDEPENDENT_AMBULATORY_CARE_PROVIDER_SITE_OTHER): Payer: 59 | Admitting: Obstetrics and Gynecology

## 2021-05-15 VITALS — BP 114/72 | HR 68 | Ht 63.5 in | Wt 186.2 lb

## 2021-05-15 DIAGNOSIS — N72 Inflammatory disease of cervix uteri: Secondary | ICD-10-CM

## 2021-05-15 DIAGNOSIS — R87612 Low grade squamous intraepithelial lesion on cytologic smear of cervix (LGSIL): Secondary | ICD-10-CM

## 2021-05-15 DIAGNOSIS — Z9889 Other specified postprocedural states: Secondary | ICD-10-CM | POA: Diagnosis present

## 2021-05-15 DIAGNOSIS — B977 Papillomavirus as the cause of diseases classified elsewhere: Secondary | ICD-10-CM

## 2021-05-15 NOTE — Progress Notes (Signed)
HPI:  Elizabeth Marks is a 40 y.o.  [id number]  who presents today for evaluation and management of abnormal cervical cytology.    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 She has had 3 follow-up Pap smears after LEEP.  LGSIL-ASCUS-LGSIL. Because of the persistent apparent dysplasia she was scheduled for colposcopy today.  ROS:  Pertinent items noted in HPI and remainder of comprehensive ROS otherwise negative.  OB History  Gravida Para Term Preterm AB Living  2 2 2  0 0 2  SAB IAB Ectopic Multiple Live Births  0 0 0 0 2    # Outcome Date GA Lbr Len/2nd Weight Sex Delivery Anes PTL Lv  2 Term 05/13/07   7 lb 13 oz (3.544 kg) M CS-LTranv Spinal N LIV     Complications: Meconium aspiration  1 Term 07/11/99   7 lb 9 oz (3.43 kg) M Vag-Spont Local N LIV    Past Medical History:  Diagnosis Date   Allergy    Anxiety 04/07/2019    Past Surgical History:  Procedure Laterality Date   CESAREAN SECTION     INTRAUTERINE DEVICE (IUD) INSERTION  08/25/2019   Procedure: INTRAUTERINE DEVICE (IUD) INSERTION;  Surgeon: Harlin Heys, MD;  Location: [facility] ORS;  Service: Gynecology;;   IUD REMOVAL  08/25/2019   Procedure: INTRAUTERINE DEVICE (IUD) REMOVAL;  Surgeon: Harlin Heys, MD;  Location: [facility] ORS;  Service: Gynecology;;   LEEP N/A 08/25/2019   Procedure: LOOP ELECTROSURGICAL EXCISION PROCEDURE (LEEP);  Surgeon: Harlin Heys, MD;  Location: [facility] ORS;  Service: Gynecology;  Laterality: N/A;   TONSILLECTOMY AND ADENOIDECTOMY     WISDOM TOOTH EXTRACTION      SOCIAL HISTORY:  Social History   Substance and Sexual Activity  Alcohol Use Yes   Alcohol/week: 1.0 standard drink   Types: 1 Standard drinks or equivalent per week   Comment: monthly    Social History   Substance and Sexual Activity  Drug Use Yes   Types: Other-see comments, Marijuana   Comment: weekly     Family History  Problem Relation Age of Onset    Diabetes Mother    Fibroids Mother    Cancer Father    Cancer Sister        uterus   Anxiety disorder Son    Kidney disease Maternal Aunt    Aneurysm Maternal Aunt    Breast cancer Neg Hx    Ovarian cancer Neg Hx    Colon cancer Neg Hx     ALLERGIES:  Corn-containing products, Shellfish allergy, Adhesive [tape], and Latex  She has a current medication list which includes the following prescription(s): cabergoline, clindamycin, escitalopram, fluticasone, levonorgestrel, and triamcinolone cream.  Physical Exam: -Vitals:  BP 114/72   Pulse 68   Ht 5' 3.5" (1.613 m)   Wt 186 lb 3.2 oz (84.5 kg)   BMI 32.47 kg/m   PROCEDURE: Colposcopy performed with 4% acetic acid after verbal consent obtained                           -Aceto-white Lesions Location(s): 2 and 8 o'clock. (Minimal changes noted)              -Biopsy performed at 2 and 8 o'clock               -ECC indicated and performed: No.     -Biopsy sites made hemostatic with  pressure and Monsel's solution   -Satisfactory colposcopy: Yes.      -Evidence of Invasive cervical CA :  NO  ASSESSMENT:  Elizabeth Marks is a 40 y.o. [id number] here for  1. Low grade squamous intraepithelial lesion on cytologic smear of cervix (LGSIL)   2. High risk human papilloma virus (HPV) infection of cervix   3. History of loop electrical excision procedure (LEEP)   .  PLAN: 1.  I discussed the grading system of pap smears and HPV high risk viral types.  We will discuss management after colpo results return.  No orders of the defined types were placed in this encounter.          F/U  Return in about 2 weeks (around 05/29/2021) for Colpo f/u.  Jeannie Fend ,MD 05/15/2021,11:25 AM

## 2021-05-16 LAB — SURGICAL PATHOLOGY

## 2021-05-29 ENCOUNTER — Encounter: Payer: Self-pay | Admitting: Obstetrics and Gynecology

## 2021-05-29 ENCOUNTER — Encounter: Payer: 59 | Admitting: Obstetrics and Gynecology

## 2021-06-10 ENCOUNTER — Encounter: Payer: 59 | Admitting: Obstetrics and Gynecology

## 2021-06-10 ENCOUNTER — Encounter: Payer: Self-pay | Admitting: Obstetrics and Gynecology

## 2021-06-11 ENCOUNTER — Ambulatory Visit: Payer: 59 | Admitting: *Deleted

## 2021-06-11 DIAGNOSIS — F419 Anxiety disorder, unspecified: Secondary | ICD-10-CM

## 2021-06-11 NOTE — Patient Instructions (Signed)
Visit Information   Goals Addressed             This Visit's Progress    Manage My Emotions       Timeframe:  Short-Term Goal Priority:  Medium Start Date:     11/08/20                        Expected End Date:  05/08/21                 Follow Up Date 07/12/2021   - talk about feelings with a friend, family or spiritual advisor - practice positive thinking and self-talk  - continue to utilize positive coping strategies to manage anxiety  Why is this important?   When you are stressed, down or upset, your body reacts too.  For example, your blood pressure may get higher; you may have a headache or stomachache.  When your emotions get the best of you, your body's ability to fight off cold and flu gets weak.  These steps will help you manage your emotions.     Notes:         The patient verbalized understanding of instructions, educational materials, and care plan provided today and declined offer to receive copy of patient instructions, educational materials, and care plan.   Telephone follow up appointment with care management team member scheduled for:07/10/21    Elliot Gurney, [place] Worker  [facility] [phone]

## 2021-06-11 NOTE — Chronic Care Management (AMB) (Signed)
Care Management Clinical Social Work Note  06/11/2021 Name: Elizabeth Marks MRN: [id number] DOB: 03-28-1981  Elizabeth Marks is a 40 y.o. year old female who is a primary care patient of Elizabeth Marks, [place].  The Care Management team was consulted for assistance with chronic disease management and coordination needs.  Engaged with patient by telephone for follow up visit in response to provider referral for social work chronic care management and care coordination services   Assessment: Review of patient past medical history, allergies, medications, and health status, including review of relevant consultants reports was performed today as part of a comprehensive evaluation and provision of chronic care management and care coordination services.  SDOH (Social Determinants of Health) assessments and interventions performed:    Advanced Directives Status: Not addressed in this encounter.  Care Plan  Allergies  Allergen Reactions   Corn-Containing Products     Abdominal pain   Shellfish Allergy     Abdominal pain   Adhesive [Tape] Rash   Latex Rash    Outpatient Encounter Medications as of 06/11/2021  Medication Sig   cabergoline (DOSTINEX) 0.5 MG tablet Take 0.5 tablets (0.25 mg total) by mouth once a week.   clindamycin (CLEOCIN T) 1 % lotion Apply 1 application topically daily.   escitalopram (LEXAPRO) 20 MG tablet Take 1 tablet (20 mg total) by mouth daily.   fluticasone (FLONASE) 50 MCG/ACT nasal spray Place into the nose.   levonorgestrel (MIRENA) 20 MCG/24HR IUD 1 each by Intrauterine route once.   triamcinolone cream (KENALOG) 0.1 % Apply 1 application topically 2 (two) times daily.   No facility-administered encounter medications on file as of 06/11/2021.    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   Vaginal low risk  HPV DNA test positive 05/18/2019   Galactorrhea 05/18/2019   Anxiety 04/07/2019   Bloating 04/07/2019    Conditions to be addressed/monitored: Anxiety; Mental Health Concerns   Care Plan : Anxiety (Adult)  Updates made by Elizabeth Claude, LCSW since 06/11/2021 12:00 AM     Problem: Symptoms (Anxiety)      Goal: Anxiety Symptoms Monitored and Managed   Start Date: 11/07/2020  Expected End Date: 06/11/2021  This Visit's Progress: On track  Recent Progress: On track  Priority: High  Note:   Current Barriers:  Mental Health Concerns  Challenges with Anxiety  Clinical Social Work Clinical Goal(s):  Over the next 90 days, patient will follow up with a mental health therapist as directed by SW  Interventions: 1:1 collaboration with Elizabeth Post, PA-C regarding development and update of comprehensive plan of care as evidenced by provider attestation and co-signature Inter-disciplinary care team collaboration (see longitudinal plan of care) Follow up provided regarding  patient's challenges with anxiety and management of her relationship Patient confirmed to now be out of state supporting a family member-conflicts regarding this decision explored-coping strategies discussed Patient's feelings processed regarding need to make difficult decisions, self management skills explored Patient continues to be consistent with her decision regarding her relationship and decisions around support for her family member Patient confirmed continued efforts to set healthy boundaries and expectations as well as advocate for others Emotional support continued to be provided,  coping strategies explored, self care emphasized, ongoing mental health counseling encouraged   Patient Goals/Self-Care Activities Over the next 90 days, patient will:   -  talk about feelings with a friend, family or spiritual advisor - practice positive thinking and self-talk  Follow up Plan: SW will follow up with patient  by phone over the next 14 business days       Follow Up Plan: SW will follow up with patient by phone over the next 30 business days    Elizabeth Marks, [place] Worker  [facility] [phone]

## 2021-06-24 ENCOUNTER — Encounter: Payer: Self-pay | Admitting: Obstetrics and Gynecology

## 2021-06-24 ENCOUNTER — Other Ambulatory Visit: Payer: Self-pay

## 2021-06-24 ENCOUNTER — Ambulatory Visit (INDEPENDENT_AMBULATORY_CARE_PROVIDER_SITE_OTHER): Payer: 59 | Admitting: Obstetrics and Gynecology

## 2021-06-24 VITALS — BP 100/64 | Resp 16 | Ht 63.0 in | Wt 177.8 lb

## 2021-06-24 DIAGNOSIS — R7989 Other specified abnormal findings of blood chemistry: Secondary | ICD-10-CM

## 2021-06-24 DIAGNOSIS — Z9889 Other specified postprocedural states: Secondary | ICD-10-CM | POA: Diagnosis not present

## 2021-06-24 DIAGNOSIS — N870 Mild cervical dysplasia: Secondary | ICD-10-CM

## 2021-06-24 NOTE — Progress Notes (Signed)
HPI:      [name] is a 40 y.o. [phone] who LMP was No LMP recorded (lmp unknown). (Menstrual status: IUD).  Subjective:   She presents today for follow-up after her colposcopy.  She has a history of CIN-3 and subsequent LEEP.  Several Paps were normal after that but her last Pap smear in July was abnormal.  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 Of significant note patient is also on Dostinex and stopped it for 2 months because she lost her prescription.  She immediately noted significant breast tenderness and has not restarted the medicine.    Hx: The following portions of the patient's history were reviewed and updated as appropriate:             She  has a past medical history of Allergy and Anxiety (04/07/2019). She does not have any pertinent problems on file. She  has a past surgical history that includes Cesarean section; Tonsillectomy and adenoidectomy; Wisdom tooth extraction; LEEP (N/A, 08/25/2019); IUD removal (08/25/2019); and Intrauterine device (iud) insertion (08/25/2019). Her family history includes Aneurysm in her maternal aunt; Anxiety disorder in her son; Cancer in her father and sister; Diabetes in her mother; Fibroids in her mother; Kidney disease in her maternal aunt. She  reports that she has been smoking cigarettes. She has been smoking an average of .25 packs per day. She has never used smokeless tobacco. She reports current alcohol use of about 1.0 standard drink per week. She reports current drug use. Drugs: Other-see comments and Marijuana. She has a current medication list which includes the following prescription(s): cabergoline, clindamycin, escitalopram, levonorgestrel, triamcinolone cream, and fluticasone. She is allergic to corn-containing products, shellfish allergy, adhesive [tape], and latex.       Review of Systems:  Review of Systems  Constitutional: Denied constitutional symptoms, night sweats, recent illness, fatigue, fever, insomnia and  weight loss.  Eyes: Denied eye symptoms, eye pain, photophobia, vision change and visual disturbance.  Ears/Nose/Throat/Neck: Denied ear, nose, throat or neck symptoms, hearing loss, nasal discharge, sinus congestion and sore throat.  Cardiovascular: Denied cardiovascular symptoms, arrhythmia, chest pain/pressure, edema, exercise intolerance, orthopnea and palpitations.  Respiratory: Denied pulmonary symptoms, asthma, pleuritic pain, productive sputum, cough, dyspnea and wheezing.  Gastrointestinal: Denied, gastro-esophageal reflux, melena, nausea and vomiting.  Genitourinary: See HPI for additional information.  Musculoskeletal: Denied musculoskeletal symptoms, stiffness, swelling, muscle weakness and myalgia.  Dermatologic: Denied dermatology symptoms, rash and scar.  Neurologic: Denied neurology symptoms, dizziness, headache, neck pain and syncope.  Psychiatric: Denied psychiatric symptoms, anxiety and depression.  Endocrine: See HPI for additional information.   Meds:   Current Outpatient Medications on File Prior to Visit  Medication Sig Dispense Refill   cabergoline (DOSTINEX) 0.5 MG tablet Take 0.5 tablets (0.25 mg total) by mouth once a week. 10 tablet 3   clindamycin (CLEOCIN T) 1 % lotion Apply 1 application topically daily. 60 mL 0   escitalopram (LEXAPRO) 20 MG tablet Take 1 tablet (20 mg total) by mouth daily. 90 tablet 3   levonorgestrel (MIRENA) 20 MCG/24HR IUD 1 each by Intrauterine route once.     triamcinolone cream (KENALOG) 0.1 % Apply 1 application topically 2 (two) times daily. 30 g 0   fluticasone (FLONASE) 50 MCG/ACT nasal spray Place into the nose.     No current facility-administered medications on file prior to visit.      Objective:     Vitals:   06/24/21 0905  BP: 100/64  Resp: 16   Filed  Weights   06/24/21 0905  Weight: 177 lb 12.8 oz (80.6 kg)              CIN-1 by colposcopically directed biopsies  Assessment:    [id number] Patient Active  Problem List   Diagnosis Date Noted   Hydradenitis 08/08/2019   Abdominal pain 07/28/2019   Elevated prolactin level 05/18/2019   LGSIL on Pap smear of cervix 05/18/2019   Vaginal low risk HPV DNA test positive 05/18/2019   Galactorrhea 05/18/2019   Anxiety 04/07/2019   Bloating 04/07/2019     1. CIN I (cervical intraepithelial neoplasia I)   2. History of loop electrical excision procedure (LEEP)   3. Elevated prolactin level        Plan:            1.  We have discussed CIN-1 in detail.  I recommended a follow-up Pap smear cotesting in 1 year.  Possibly colposcopy at that time if Pap remains abnormal.  2.  We have discussed Dostinex use and breast tenderness.  Patient now back on her normal dose. Orders No orders of the defined types were placed in this encounter.   No orders of the defined types were placed in this encounter.     F/U  Return in about 11 months (around 05/24/2022) for Annual Physical. I spent 21 minutes involved in the care of this patient preparing to see the patient by obtaining and reviewing her medical history (including labs, imaging tests and prior procedures), documenting clinical information in the electronic health record (EHR), counseling and coordinating care plans, writing and sending prescriptions, ordering tests or procedures and in direct communicating with the patient and medical staff discussing pertinent items from her history and physical exam.  Finis Bud, M.D. 06/24/2021 9:22 AM

## 2021-07-08 ENCOUNTER — Ambulatory Visit: Payer: 59 | Admitting: *Deleted

## 2021-07-08 DIAGNOSIS — F419 Anxiety disorder, unspecified: Secondary | ICD-10-CM

## 2021-07-08 NOTE — Chronic Care Management (AMB) (Signed)
Care Management    Clinical Social Work Note  07/08/2021 Name: Elizabeth Marks MRN: [id number] DOB: 1981-05-07  Elizabeth Marks is a 40 y.o. year old female who is a primary care patient of Elizabeth Marks, [place]. The CCM team was consulted to assist the patient with chronic disease management and/or care coordination needs related to: Mental Health Counseling and Resources.   Engaged with patient by telephone for follow up visit in response to provider referral for social work  care management and care coordination services.   Consent to Services:  The patient was given information about Chronic Care Management services, agreed to services, and gave verbal consent prior to initiation of services.  Please see initial visit note for detailed documentation.   Patient agreed to services and consent obtained.   Assessment: Review of patient past medical history, allergies, medications, and health status, including review of relevant consultants reports was performed today as part of a comprehensive evaluation and provision of chronic care management and care coordination services.     SDOH (Social Determinants of Health) assessments and interventions performed:    Advanced Directives Status: Not addressed in this encounter.  CCM Care Plan  Allergies  Allergen Reactions   Corn-Containing Products     Abdominal pain   Shellfish Allergy     Abdominal pain   Adhesive [Tape] Rash   Latex Rash    Outpatient Encounter Medications as of 07/08/2021  Medication Sig   cabergoline (DOSTINEX) 0.5 MG tablet Take 0.5 tablets (0.25 mg total) by mouth once a week.   clindamycin (CLEOCIN T) 1 % lotion Apply 1 application topically daily.   escitalopram (LEXAPRO) 20 MG tablet Take 1 tablet (20 mg total) by mouth daily.   fluticasone (FLONASE) 50 MCG/ACT nasal spray Place into the nose.   levonorgestrel (MIRENA) 20 MCG/24HR IUD 1 each by Intrauterine route once.   triamcinolone cream (KENALOG)  0.1 % Apply 1 application topically 2 (two) times daily.   No facility-administered encounter medications on file as of 07/08/2021.    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   Vaginal low risk HPV DNA test positive 05/18/2019   Galactorrhea 05/18/2019   Anxiety 04/07/2019   Bloating 04/07/2019    Conditions to be addressed/monitored: Anxiety; Mental Health Concerns   Care Plan : Anxiety (Adult)  Updates made by Vern Claude, LCSW since 07/08/2021 12:00 AM     Problem: Symptoms (Anxiety)      Goal: Anxiety Symptoms Monitored and Managed   Start Date: 11/07/2020  Expected End Date: 06/11/2021  This Visit's Progress: On track  Recent Progress: On track  Priority: High  Note:   Current Barriers:  Mental Health Concerns  Challenges with Anxiety  Clinical Social Work Clinical Goal(s):  Over the next 90 days, patient will follow up with a mental health therapist as directed by SW  Interventions: 1:1 collaboration with Elizabeth Post, PA-C regarding development and update of comprehensive plan of care as evidenced by provider attestation and co-signature Inter-disciplinary care team collaboration (see longitudinal plan of care) Follow up provided regarding  patient's challenges with anxiety and management of her relationship Patient confirmed to now be home and is working on getting her own place with plan to return to assist her family member in the next few weeks Patient's feelings processed regarding need  to make difficult decisions, her availability to help and assist others all while prioritizing self management care Patient continues to utilize personal boundaries in regards to decisions around support for her family member Patient confirmed continued efforts to set healthy boundaries and expectations as well as advocate for  others Emotional support continued to be provided,  coping strategies explored, self care emphasized, ongoing mental health counseling encouraged   Patient Goals/Self-Care Activities Over the next 90 days, patient will:   - talk about feelings with a friend, family or spiritual advisor - practice positive thinking and self-talk  Follow up Plan: SW will follow up with patient by phone over the next 14 business days       Follow Up Plan: SW will follow up with patient by phone over the next 30 business days       [place], [place] Worker  [facility] [phone]

## 2021-07-08 NOTE — Patient Instructions (Signed)
Visit Information   Goals Addressed             This Visit's Progress    Manage My Emotions       Timeframe:  Short-Term Goal Priority:  Medium Start Date:     11/08/20                        Expected End Date:  05/08/21                 Follow Up Date 08/07/2021   - talk about feelings with a friend, family or spiritual advisor - practice positive thinking and self-talk  - continue to utilize positive coping strategies to manage anxiety  Why is this important?   When you are stressed, down or upset, your body reacts too.  For example, your blood pressure may get higher; you may have a headache or stomachache.  When your emotions get the best of you, your body's ability to fight off cold and flu gets weak.  These steps will help you manage your emotions.     Notes:         The patient verbalized understanding of instructions, educational materials, and care plan provided today and declined offer to receive copy of patient instructions, educational materials, and care plan.   Telephone follow up appointment with care management team member scheduled for: 08/07/21   Elliot Gurney, [place] Worker  [facility] [phone]

## 2021-07-17 ENCOUNTER — Ambulatory Visit (INDEPENDENT_AMBULATORY_CARE_PROVIDER_SITE_OTHER): Payer: 59 | Admitting: Family Medicine

## 2021-07-17 ENCOUNTER — Encounter: Payer: Self-pay | Admitting: Family Medicine

## 2021-07-17 ENCOUNTER — Other Ambulatory Visit (HOSPITAL_COMMUNITY)
Admission: RE | Admit: 2021-07-17 | Discharge: 2021-07-17 | Disposition: A | Discharge status: Home / Self Care | Payer: 59 | Source: Ambulatory Visit | Attending: Family Medicine | Admitting: Family Medicine

## 2021-07-17 ENCOUNTER — Other Ambulatory Visit: Payer: Self-pay

## 2021-07-17 VITALS — BP 113/80 | HR 76 | Temp 98.2°F | Wt 178.0 lb

## 2021-07-17 DIAGNOSIS — R87821 Vaginal low risk human papillomavirus (HPV) DNA test positive: Secondary | ICD-10-CM

## 2021-07-17 DIAGNOSIS — N898 Other specified noninflammatory disorders of vagina: Secondary | ICD-10-CM | POA: Diagnosis not present

## 2021-07-17 DIAGNOSIS — Z202 Contact with and (suspected) exposure to infections with a predominantly sexual mode of transmission: Secondary | ICD-10-CM | POA: Diagnosis not present

## 2021-07-17 NOTE — Assessment & Plan Note (Signed)
New onset General discomfort Complaint x4 days Has not tried anything for relief

## 2021-07-17 NOTE — Assessment & Plan Note (Signed)
Now past partner, told patient that they had been 'cheating' when the patient asked why she was having vaginal complaints including itching, discomfort and discharge  Pt advised given possible date of exposure- that this may serve as preliminary testing; and to RTC if additional symptoms arise.  Pt voiced understanding

## 2021-07-17 NOTE — Assessment & Plan Note (Signed)
Complaint of discharge from Monday- today Associated with itching and general discomfort

## 2021-07-17 NOTE — Assessment & Plan Note (Signed)
Known HPV; no further testing at this time

## 2021-07-17 NOTE — Addendum Note (Signed)
Addended by: Ashley Royalty E on: 07/17/2021 10:04 AM   Modules accepted: Orders

## 2021-07-17 NOTE — Progress Notes (Signed)
Established patient visit   Patient: Elizabeth Marks   DOB: July 15, 1981   40 y.o. Female  MRN: [id number] Visit Date: 07/17/2021  Today's healthcare provider: Gwyneth Sprout, FNP   Chief Complaint  Patient presents with   Vaginal Discharge   Subjective    Vaginal Discharge The patient's primary symptoms include vaginal discharge. This is a new problem. Episode onset: Started 07/14/2021. The problem has been unchanged. Pertinent negatives include no abdominal pain, back pain, dysuria, fever, flank pain, hematuria or urgency. The vaginal discharge was yellow. There has been no bleeding. She has tried nothing for the symptoms. She is sexually active. Yes, her partner has an STD.  Patient does not feel that she needs pregnancy testing as she has a Mirena IUD.     Medications: Outpatient Medications Prior to Visit  Medication Sig   cabergoline (DOSTINEX) 0.5 MG tablet Take 0.5 tablets (0.25 mg total) by mouth once a week.   clindamycin (CLEOCIN T) 1 % lotion Apply 1 application topically daily.   escitalopram (LEXAPRO) 20 MG tablet Take 1 tablet (20 mg total) by mouth daily.   levonorgestrel (MIRENA) 20 MCG/24HR IUD 1 each by Intrauterine route once.   triamcinolone cream (KENALOG) 0.1 % Apply 1 application topically 2 (two) times daily.   fluticasone (FLONASE) 50 MCG/ACT nasal spray Place into the nose.   No facility-administered medications prior to visit.    Review of Systems  Constitutional: Negative.  Negative for fever.  Gastrointestinal: Negative.  Negative for abdominal pain.  Genitourinary:  Positive for vaginal discharge. Negative for decreased urine volume, difficulty urinating, dysuria, flank pain, hematuria, urgency, vaginal bleeding and vaginal pain.  Musculoskeletal:  Negative for back pain.      Objective    BP 113/80 (BP Location: Left Arm, Patient Position: Sitting, Cuff Size: Large)   Pulse 76   Temp 98.2 F (36.8 C) (Oral)   Wt 178 lb (80.7 kg)    LMP  (LMP Unknown)   BMI 31.53 kg/m    Physical Exam Vitals and nursing note reviewed.  Constitutional:      General: She is not in acute distress.    Appearance: Normal appearance. She is obese. She is not ill-appearing, toxic-appearing or diaphoretic.  HENT:     Head: Normocephalic and atraumatic.  Cardiovascular:     Rate and Rhythm: Normal rate and regular rhythm.     Heart sounds: Normal heart sounds. No murmur heard.   No friction rub. No gallop.  Pulmonary:     Effort: Pulmonary effort is normal. No respiratory distress.     Breath sounds: Normal breath sounds. No stridor. No wheezing, rhonchi or rales.  Chest:     Chest wall: No tenderness.  Abdominal:     General: Bowel sounds are normal.     Palpations: Abdomen is soft.     Tenderness: There is no abdominal tenderness. There is no right CVA tenderness, guarding or rebound.  Genitourinary:    Comments: Exam deferred; urine and blood sampling completed Complaints of itching and discharge x4 days- prompted conversation with partner that resulted in confirmation of risky behaviors by partner Musculoskeletal:        General: No swelling, tenderness, deformity or signs of injury. Normal range of motion.     Cervical back: Normal range of motion.     Right lower leg: No edema.     Left lower leg: No edema.  Skin:    General: Skin is warm and  dry.     Capillary Refill: Capillary refill takes less than 2 seconds.     Coloration: Skin is not jaundiced or pale.     Findings: No bruising, erythema, lesion or rash.  Neurological:     General: No focal deficit present.     Mental Status: She is alert and oriented to person, place, and time. Mental status is at baseline.     Cranial Nerves: No cranial nerve deficit.     Sensory: No sensory deficit.     Motor: No weakness.     Coordination: Coordination normal.  Psychiatric:        Mood and Affect: Mood is depressed.        Behavior: Behavior normal.        Thought Content:  Thought content normal.        Judgment: Judgment normal.     Comments: Frustrated with given circumstances     No results found for any visits on 07/17/21.  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  Pt voiced understanding      Relevant Orders   Chlamydia/GC NAA, Confirmation   HIV antibody (with reflex)   Hepatitis C antibody   RPR     Return if symptoms worsen or fail to improve.      Vonna Kotyk, FNP, have reviewed all documentation for this visit. The documentation on 07/17/21 for the exam, diagnosis, procedures, and orders are all accurate and complete.    Gwyneth Sprout, [place] [phone] (phone) [phone] (fax)  [place]

## 2021-07-18 LAB — HIV ANTIBODY (ROUTINE TESTING W REFLEX): HIV Screen 4th Generation wRfx: NONREACTIVE

## 2021-07-18 LAB — RPR: RPR Ser Ql: NONREACTIVE

## 2021-07-18 LAB — HEPATITIS C ANTIBODY: Hep C Virus Ab: <0.1 s/co ratio (ref 0.0–0.9)

## 2021-07-21 LAB — URINE CYTOLOGY ANCILLARY ONLY
Chlamydia: NEGATIVE
Comment: NEGATIVE
Comment: NEGATIVE
Comment: NORMAL
Neisseria Gonorrhea: NEGATIVE
Trichomonas: NEGATIVE

## 2021-07-25 IMAGING — CR DG ABDOMEN 1V
1 series · 2 of 2 positions shown · non-contrast
Comparison: None.

CLINICAL DATA: Abdominal pain

EXAM:
ABDOMEN - 1 VIEW

[Series 1: dg abd 1 view · 0.14mm/px · 2 of 2 slices shown]
[im 1/2]
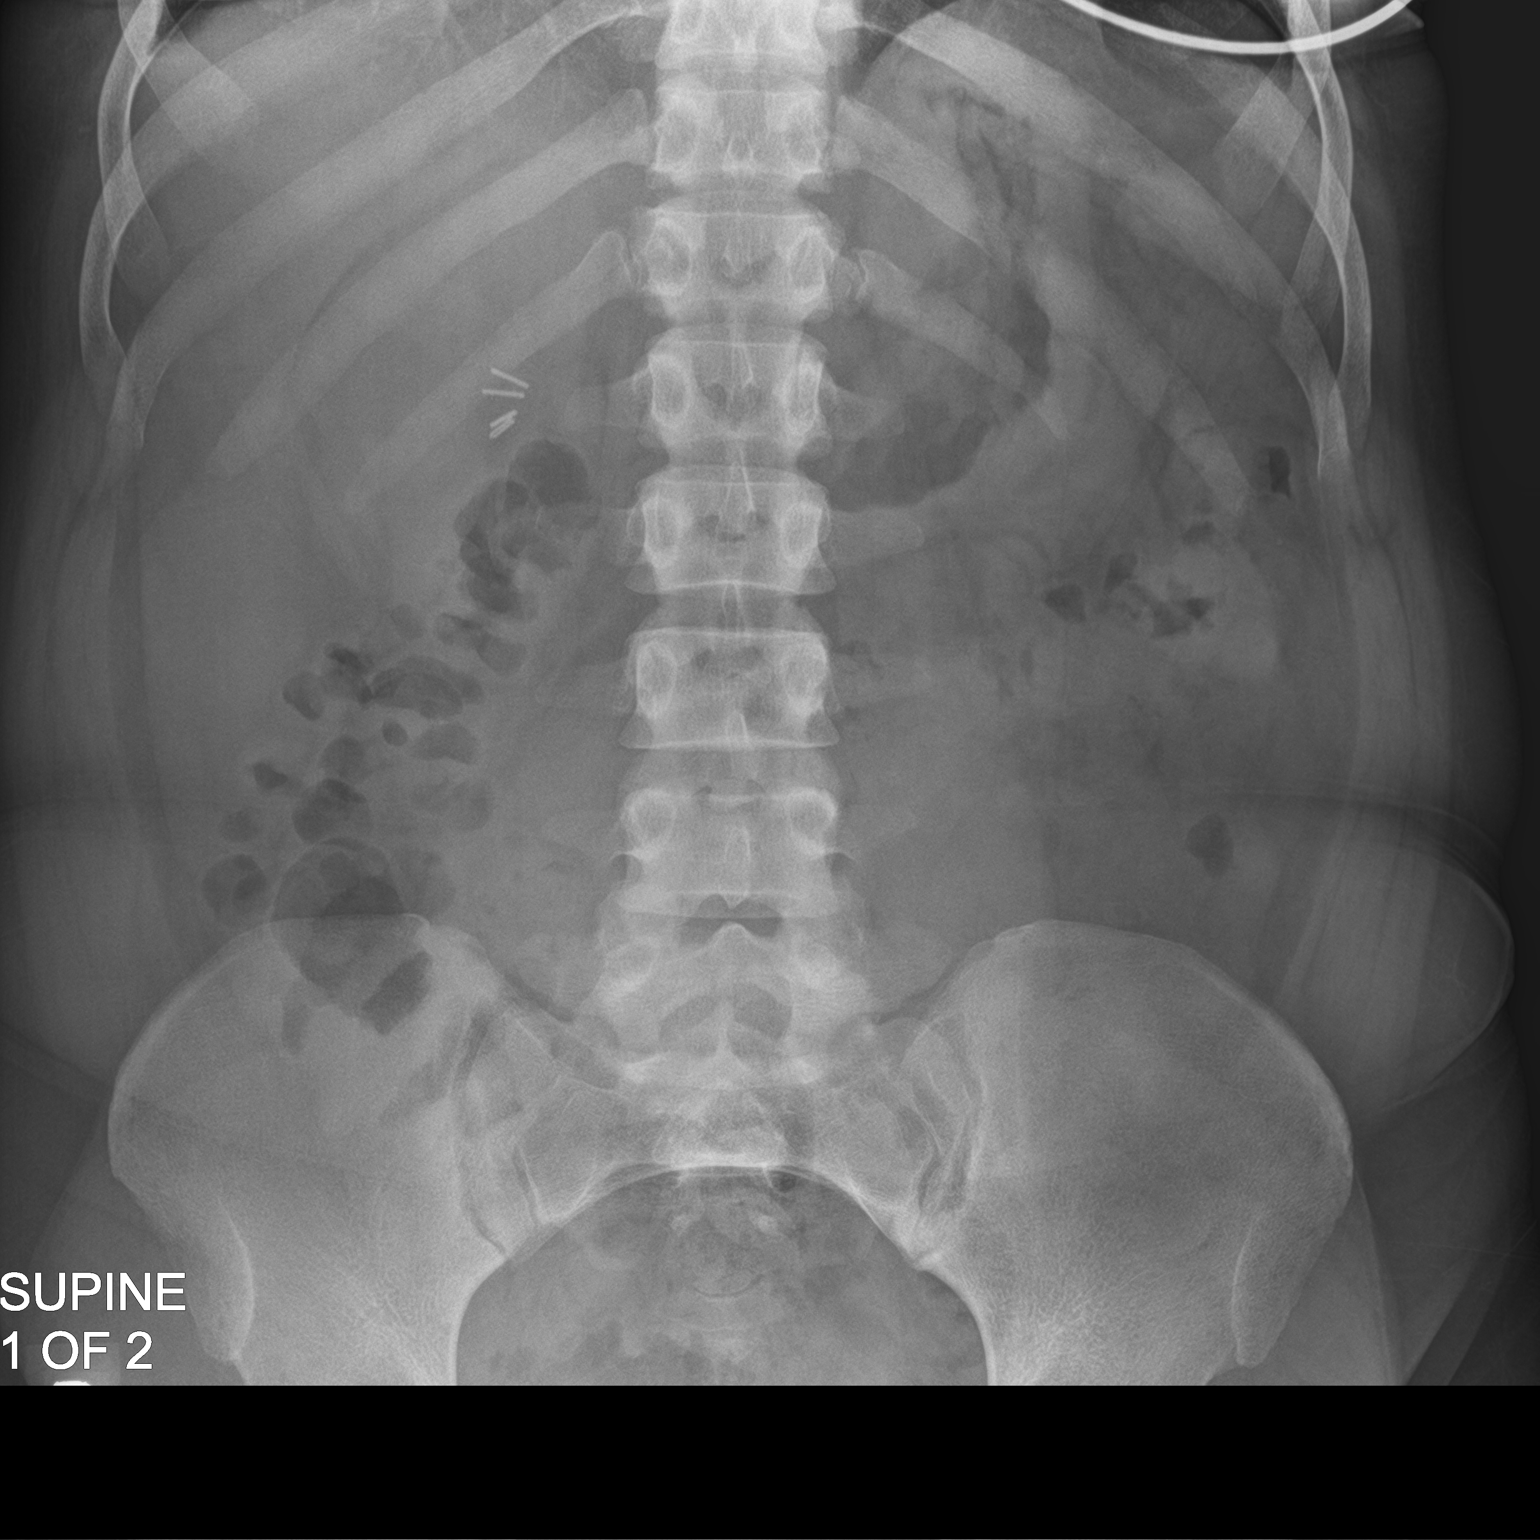
[im 2/2]
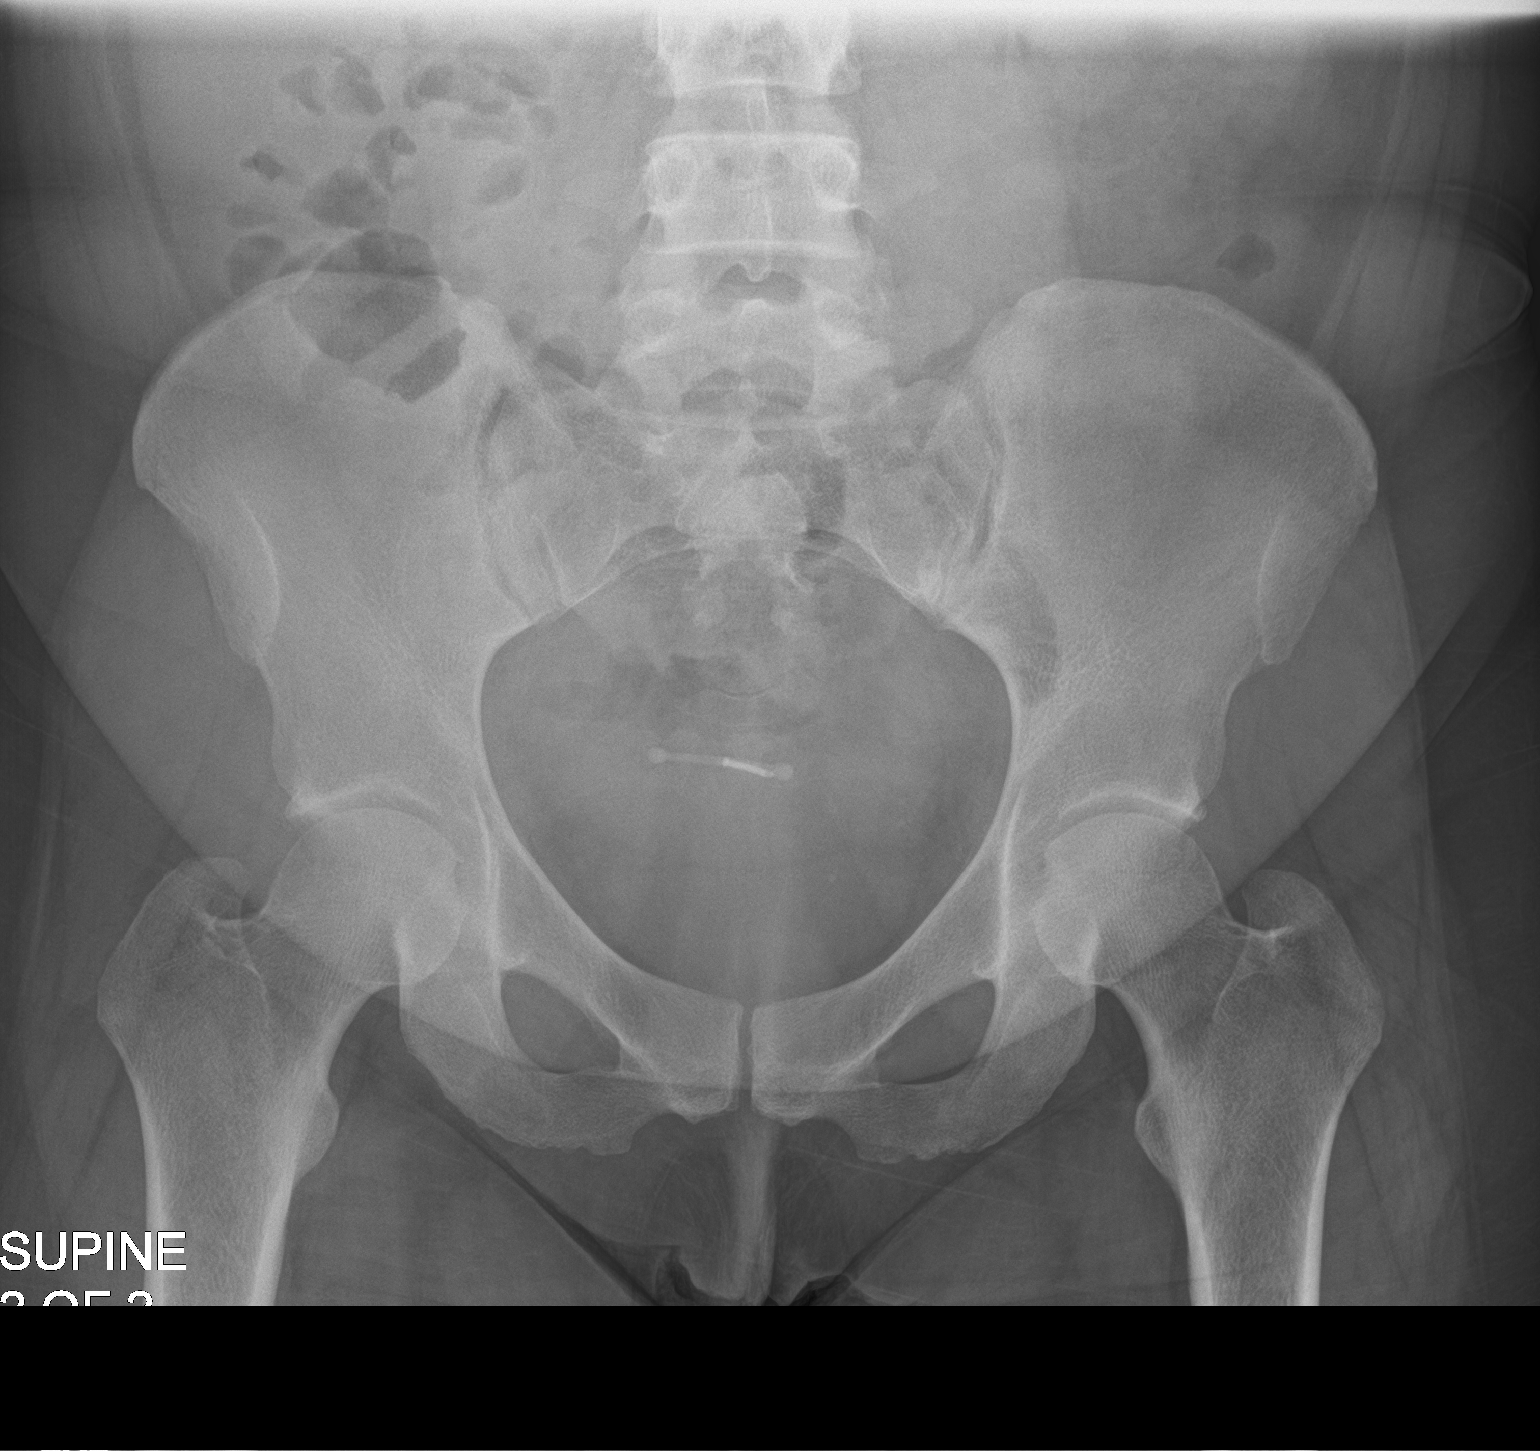

[2 of 2 positions shown; findings below may reference images not displayed]

FINDINGS: Prior cholecystectomy. IUD noted in the central pelvis. There is a
non obstructive bowel gas pattern. No supine evidence of free air.
No organomegaly or suspicious calcification. No acute bony
abnormality.
IMPRESSION: No acute findings.

## 2021-07-28 IMAGING — US US PELVIS COMPLETE TRANSABD/TRANSVAG W DUPLEX
1 series · 14 of 25 positions shown · non-contrast
Comparison: CT 04/15/2020

CLINICAL DATA: Chronic pelvic pain.  Abnormal CT

EXAM:
TRANSABDOMINAL AND TRANSVAGINAL ULTRASOUND OF PELVIS
DOPPLER ULTRASOUND OF OVARIES
TECHNIQUE: Both transabdominal and transvaginal ultrasound examinations of the
pelvis were performed. Transabdominal technique was performed for
global imaging of the pelvis including uterus, ovaries, adnexal
regions, and pelvic cul-de-sac.
It was necessary to proceed with endovaginal exam following the
transabdominal exam to visualize the adnexa. Color and duplex
Doppler ultrasound was utilized to evaluate blood flow to the
ovaries.

[Series 1: us pelvis complete transabd/transvag w duplex · 87 acquisitions, 14 frames shown]
[im 1/87]
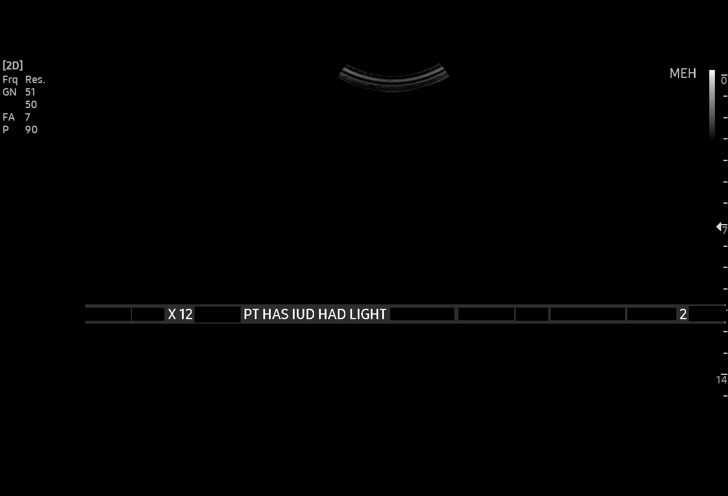
[im 8/87]
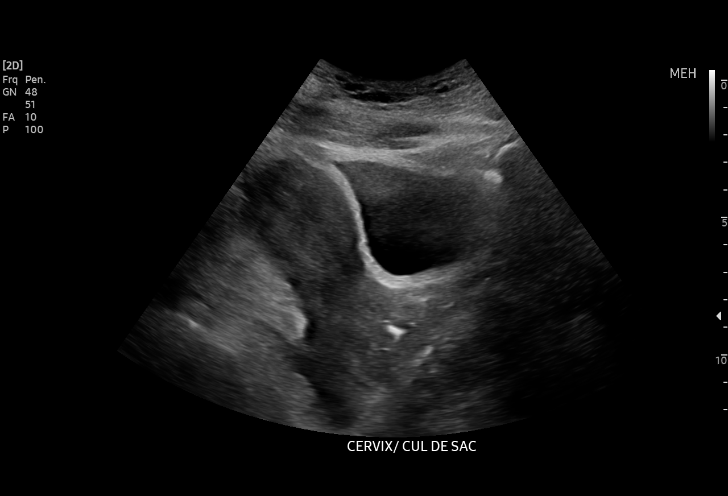
[im 15/87]
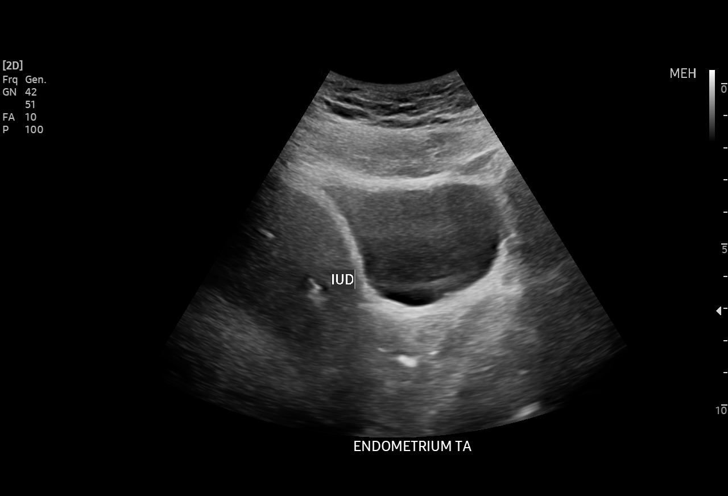
[im 22/87]
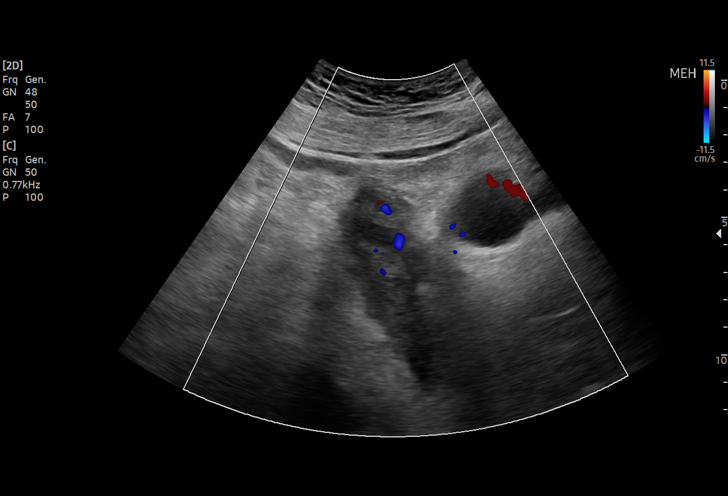
[im 29/87]
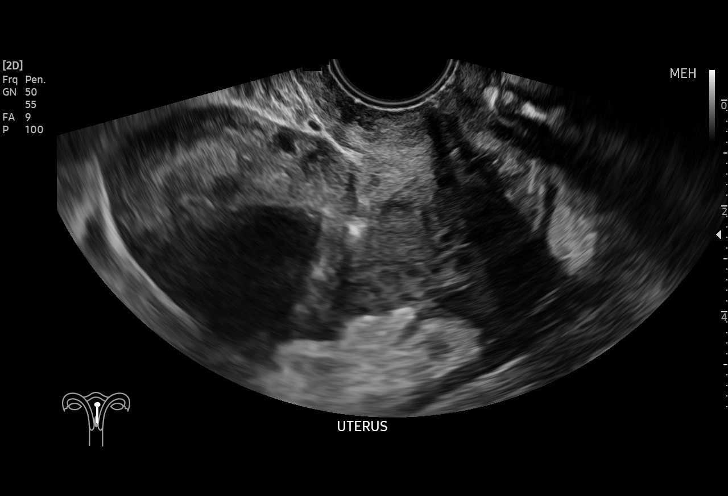
[im 33/87]
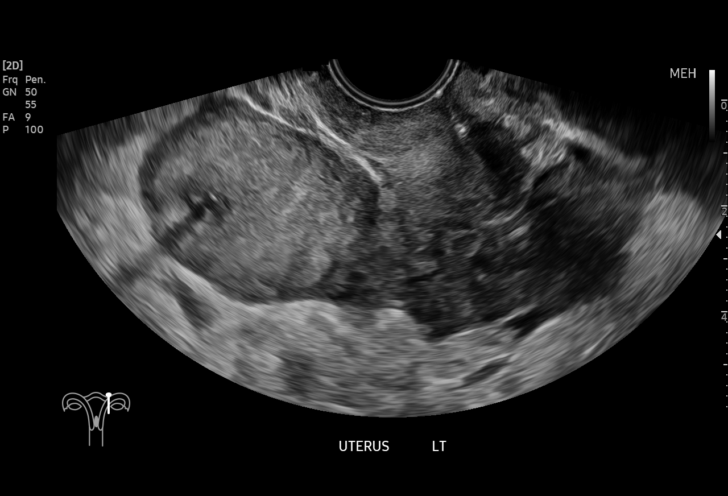
[im 40/87]
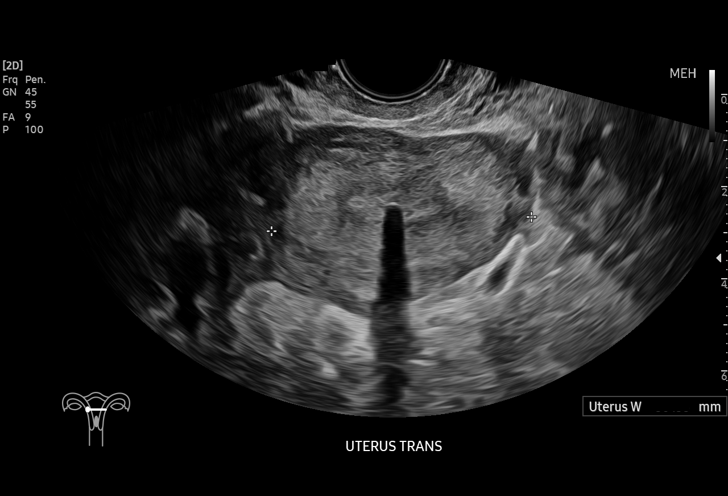
[im 47/87]
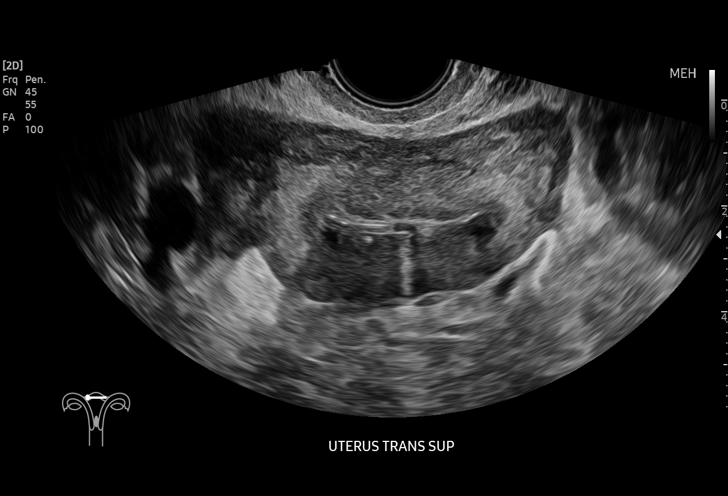
[im 54/87]
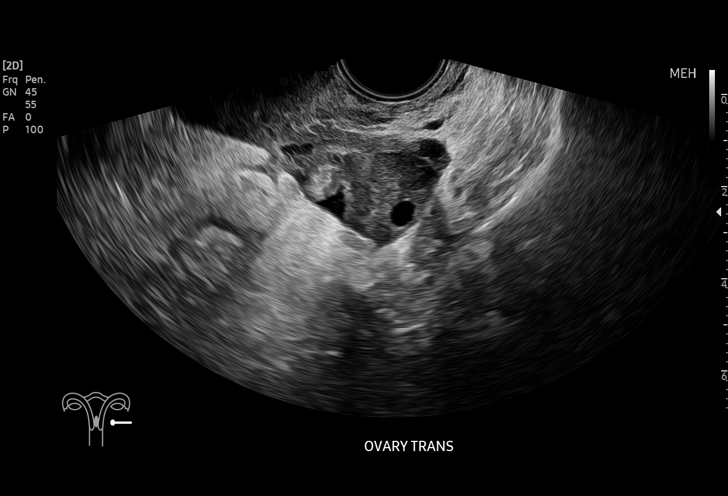
[im 58/87]
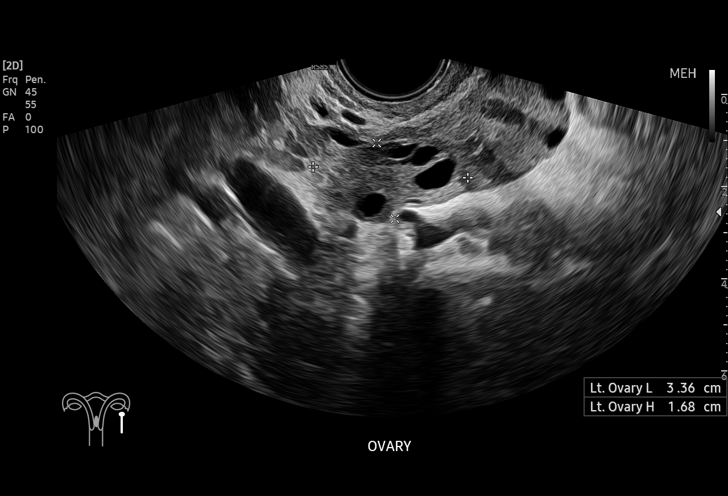
[im 65/87]
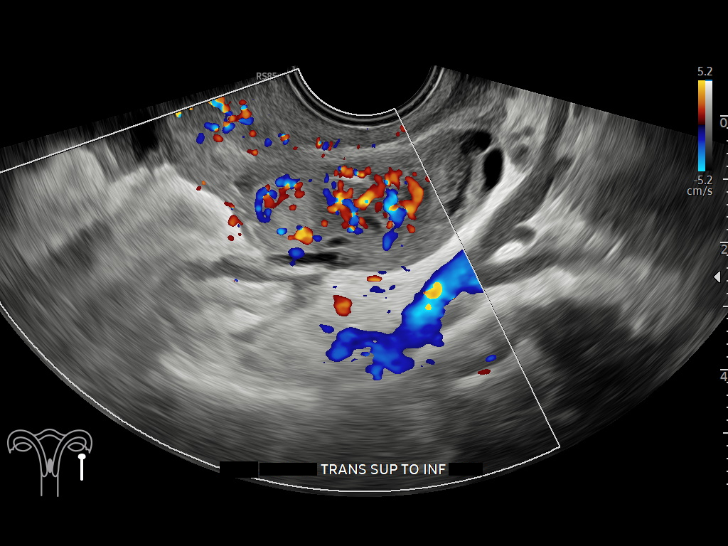
[im 72/87]
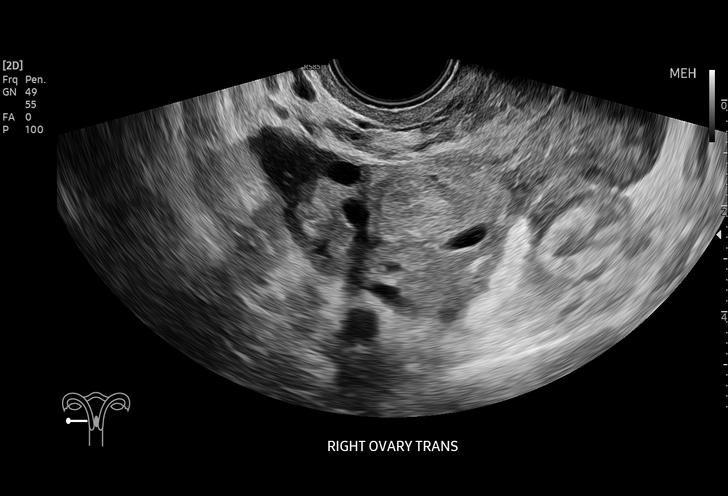
[im 79/87]
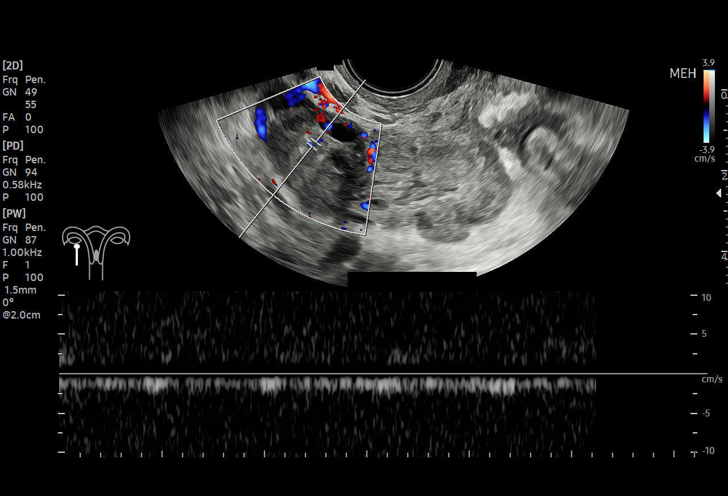
[im 87/87]
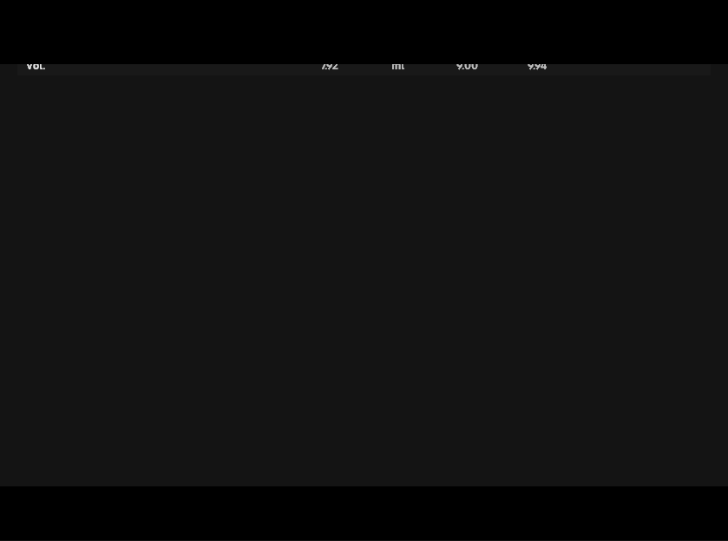

[14 of 25 positions shown; findings below may reference images not displayed]

FINDINGS: Uterus

Measurements: 8.4 x 3.7 x 5.7 cm = volume: 107 mL. No fibroids or
other mass visualized.

Endometrium

Thickness: 2 mm.  IUD is present within the endometrial canal.

Right ovary

Measurements: 2.9 x 1.9 x 1.6 cm = volume: 5 mL. Normal appearance
of the right ovary.

Left ovary

Measurements: 3.4 x 1.7 x 2.7 cm = volume: 8 mL. Normal appearance
of the left ovary.

Pulsed Doppler evaluation of both ovaries demonstrates normal
low-resistance arterial and venous waveforms.

Other findings

Elongated tubular structures within the bilateral adnexal regions
are present without well-defined fluid collection. Small volume free
fluid within the cul-de-sac.
IMPRESSION: 1. Prominent elongated tubular structures in the bilateral adnexal
regions are favored to represent prominent fallopian tubes findings
may reflect sequela of pelvic inflammatory disease. No fluid
collection or evidence of acute inflammation to suggest tubo-ovarian
abscess.
2. Unremarkable appearance of the ovaries without evidence of
adnexal torsion.

## 2021-08-06 ENCOUNTER — Telehealth: Payer: Self-pay | Admitting: *Deleted

## 2021-08-06 ENCOUNTER — Ambulatory Visit: Payer: 59 | Admitting: *Deleted

## 2021-08-06 DIAGNOSIS — F419 Anxiety disorder, unspecified: Secondary | ICD-10-CM

## 2021-08-06 NOTE — Telephone Encounter (Signed)
  Care Management   Follow Up Note   08/06/2021 Name: Elizabeth Marks MRN: [id number] DOB: 1981/08/27   Referred by: Gwyneth Sprout, FNP Reason for referral : Care Coordination   Successful contact made however patient was not able to talk at this time. Requested a call back this afternoon.  Follow Up Plan: Telephone follow up appointment with care management team member scheduled for: 08/06/21 1:30pm   [place], [place] Worker  [facility] Care Management [phone]

## 2021-08-07 ENCOUNTER — Telehealth: Payer: 59

## 2021-08-07 NOTE — Patient Instructions (Signed)
Visit Information   Goals Addressed             This Visit's Progress    Manage My Emotions       Timeframe:  Short-Term Goal Priority:  Medium Start Date:     11/08/20                        Expected End Date:  05/08/21                 Follow Up Date 08/21/2021   - talk about feelings with a friend, family or spiritual advisor - practice positive thinking and self-talk  - continue to utilize positive coping strategies to manage anxiety  Why is this important?   When you are stressed, down or upset, your body reacts too.  For example, your blood pressure may get higher; you may have a headache or stomachache.  When your emotions get the best of you, your body's ability to fight off cold and flu gets weak.  These steps will help you manage your emotions.     Notes:         The patient verbalized understanding of instructions, educational materials, and care plan provided today and declined offer to receive copy of patient instructions, educational materials, and care plan.   Telephone follow up appointment with care management team member scheduled for: 08/21/21   Elliot Gurney, [place] Worker  [facility] Care Management [phone]

## 2021-08-07 NOTE — Chronic Care Management (AMB) (Signed)
Care Management Clinical Social Work Note  08/07/2021 Name: Elizabeth Marks MRN: [id number] DOB: January 16, 1981  Elizabeth Marks is a 40 y.o. year old female who is a primary care patient of Gwyneth Sprout, FNP.  The Care Management team was consulted for assistance with chronic disease management and coordination needs.  Engaged with patient by telephone for follow up visit in response to provider referral for social work chronic care management and care coordination services  Consent to Services:  [name] was given information about Care Management services today including:  Care Management services includes personalized support from designated clinical staff supervised by her physician, including individualized plan of care and coordination with other care providers 24/7 contact phone numbers for assistance for urgent and routine care needs. The patient may stop case management services at any time by phone call to the office staff.  Patient agreed to services and consent obtained.   Assessment: Review of patient past medical history, allergies, medications, and health status, including review of relevant consultants reports was performed today as part of a comprehensive evaluation and provision of chronic care management and care coordination services.  SDOH (Social Determinants of Health) assessments and interventions performed:    Advanced Directives Status: Not addressed in this encounter.  Care Plan  Allergies  Allergen Reactions   Corn-Containing Products     Abdominal pain   Shellfish Allergy     Abdominal pain   Adhesive [Tape] Rash   Latex Rash    Outpatient Encounter Medications as of 08/06/2021  Medication Sig   cabergoline (DOSTINEX) 0.5 MG tablet Take 0.5 tablets (0.25 mg total) by mouth once a week.   clindamycin (CLEOCIN T) 1 % lotion Apply 1 application topically daily.   escitalopram (LEXAPRO) 20 MG tablet Take 1 tablet (20 mg total) by mouth daily.    levonorgestrel (MIRENA) 20 MCG/24HR IUD 1 each by Intrauterine route once.   triamcinolone cream (KENALOG) 0.1 % Apply 1 application topically 2 (two) times daily.   No facility-administered encounter medications on file as of 08/06/2021.    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 05/18/2019   Galactorrhea 05/18/2019   Anxiety 04/07/2019   Bloating 04/07/2019    Conditions to be addressed/monitored: Anxiety; Mental Health Concerns   Care Plan : Anxiety (Adult)  Updates made by Vern Claude, LCSW since 08/07/2021 12:00 AM     Problem: Symptoms (Anxiety)      Goal: Anxiety Symptoms Monitored and Managed   Start Date: 11/07/2020  Expected End Date: 06/11/2021  This Visit's Progress: On track  Recent Progress: On track  Priority: High  Note:   Current Barriers:  Mental Health Concerns  Challenges with Anxiety Relationship conflicts  Clinical Social Work Clinical Goal(s):  Over the next 90 days, patient will follow up with a mental health therapist as directed by SW  Interventions: 1:1 collaboration with Trinna Post, PA-C regarding development and update of comprehensive plan of care as evidenced by provider attestation and co-signature Inter-disciplinary care team collaboration (see longitudinal plan of care) Follow up provided regarding  patient's challenges with anxiety and management of her relationship Patient confirmed to now be home, 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 Patient's feelings processed regarding need to make difficult decisions, prioritizing self care Patient continues to utilize personal boundaries in regards to decisions  around support for her family member Patient confirmed continued efforts to set healthy boundaries and expectations as well as advocate for others Emotional support continued to be provided,  coping strategies explored, self care emphasized, ongoing mental health counseling encouraged   Patient Goals/Self-Care Activities Over the next 90 days, patient will:   - talk about feelings with a friend, family or spiritual advisor - practice positive thinking and self-talk  Follow up Plan: SW will follow up with patient by phone over the next 14-21 business days       Follow Up Plan: SW will follow up with patient by phone over the next 14-21 business days   [place], [place] Worker  [place] [facility] Management [phone]

## 2021-08-21 ENCOUNTER — Ambulatory Visit: Payer: 59 | Admitting: *Deleted

## 2021-08-21 DIAGNOSIS — F419 Anxiety disorder, unspecified: Secondary | ICD-10-CM

## 2021-08-22 NOTE — Patient Instructions (Signed)
Visit Information   Goals Addressed             This Visit's Progress    Manage My Emotions       Timeframe:  Short-Term Goal Priority:  Medium Start Date:     11/08/20                        Expected End Date:  05/08/21                 Follow Up Date 09/04/2021   - talk about feelings with a friend, family or spiritual advisor - practice positive thinking and self-talk  - continue to utilize positive coping strategies to manage anxiety  Why is this important?   When you are stressed, down or upset, your body reacts too.  For example, your blood pressure may get higher; you may have a headache or stomachache.  When your emotions get the best of you, your body's ability to fight off cold and flu gets weak.  These steps will help you manage your emotions.     Notes:         The patient verbalized understanding of instructions, educational materials, and care plan provided today and declined offer to receive copy of patient instructions, educational materials, and care plan.   Telephone follow up appointment with care management team member scheduled for: 09/04/21   Elliot Gurney, [place] Worker  [facility] [phone]

## 2021-08-22 NOTE — Chronic Care Management (AMB) (Signed)
Care Management Clinical Social Work Note  08/22/2021 Name: Elizabeth Marks MRN: [id number] DOB: [birth year]  Elizabeth Marks is a 40 y.o. year old female who is a primary care patient of Gwyneth Sprout, FNP.  The Care Management team was consulted for assistance with chronic disease management and coordination needs.  Engaged with patient by telephone for follow up visit in response to provider referral for social work chronic care management and care coordination services  Assessment: Review of patient past medical history, allergies, medications, and health status, including review of relevant consultants reports was performed today as part of a comprehensive evaluation and provision of chronic care management and care coordination services.  SDOH (Social Determinants of Health) assessments and interventions performed:    Advanced Directives Status: Not addressed in this encounter.  Care Plan  Allergies  Allergen Reactions   Corn-Containing Products     Abdominal pain   Shellfish Allergy     Abdominal pain   Adhesive [Tape] Rash   Latex Rash    Outpatient Encounter Medications as of 08/21/2021  Medication Sig   cabergoline (DOSTINEX) 0.5 MG tablet Take 0.5 tablets (0.25 mg total) by mouth once a week.   clindamycin (CLEOCIN T) 1 % lotion Apply 1 application topically daily.   escitalopram (LEXAPRO) 20 MG tablet Take 1 tablet (20 mg total) by mouth daily.   levonorgestrel (MIRENA) 20 MCG/24HR IUD 1 each by Intrauterine route once.   triamcinolone cream (KENALOG) 0.1 % Apply 1 application topically 2 (two) times daily.   No facility-administered encounter medications on file as of 08/21/2021.    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    Vaginal low risk HPV DNA test positive 05/18/2019   Galactorrhea 05/18/2019   Anxiety 04/07/2019   Bloating 04/07/2019    Conditions to be addressed/monitored: Anxiety; Mental Health Concerns   Care Plan : Anxiety (Adult)  Updates made by Vern Claude, LCSW since 08/22/2021 12:00 AM     Problem: Symptoms (Anxiety)      Goal: Anxiety Symptoms Monitored and Managed   Start Date: 11/07/2020  Expected End Date: 06/11/2021  This Visit's Progress: On track  Recent Progress: On track  Priority: High  Note:   Current Barriers:  Mental Health Concerns  Challenges with Anxiety Relationship conflicts  Clinical Social Work Clinical Goal(s):  Over the next 90 days, patient will follow up with a mental health therapist as directed by SW  Interventions:  1:1 collaboration with Trinna Post, PA-C regarding development and update of comprehensive plan of care as evidenced by provider attestation and co-signature Inter-disciplinary care team collaboration (see longitudinal plan of care) Follow up provided regarding  patient's challenges with anxiety and management of her relationship 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 Confirmed focus now on her own independence Patient's feelings processed regarding need to make 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, prioritizing self care Patient continues to utilize personal boundaries in regards to decisions around support for her family member Patient confirmed continued efforts to set healthy boundaries and expectations as well as advocate for others Emotional support continued to be provided,  coping strategies explored, self care emphasized, ongoing mental health counseling encouraged  Patient Goals/Self-Care Activities Over the next 90 days, patient will:   - talk about feelings with a friend, family or spiritual advisor - practice positive thinking and  self-talk  Follow up Plan: SW will follow up with patient by phone over the next 14-21 business days       Follow Up Plan: SW will follow up with patient by phone over the next 14 business days   [place], [place] Worker  [place] [facility] Management [phone]

## 2021-09-04 ENCOUNTER — Ambulatory Visit: Payer: 59 | Admitting: *Deleted

## 2021-09-04 DIAGNOSIS — F419 Anxiety disorder, unspecified: Secondary | ICD-10-CM

## 2021-09-04 NOTE — Patient Instructions (Signed)
Visit Information  PATIENT GOALS/PLAN OF CARE:  Care Plan : Anxiety (Adult)  Updates made by Vern Claude, LCSW since 09/04/2021 12:00 AM     Problem: Symptoms (Anxiety)      Goal: Anxiety Symptoms Monitored and Managed   Start Date: 11/07/2020  Expected End Date: 06/11/2021  This Visit's Progress: On track  Recent Progress: On track  Priority: High  Note:   Current Barriers:  Mental Health Concerns  Challenges with Anxiety Relationship conflicts  Clinical Social Work Clinical Goal(s):  Over the next 90 days, patient will follow up with a mental health therapist as directed by SW  Interventions:  1:1 collaboration with Trinna Post, PA-C regarding development and update of comprehensive plan of care as evidenced by provider attestation and co-signature Inter-disciplinary care team collaboration (see longitudinal plan of care) Follow up provided regarding  patient's challenges with anxiety and management of family relationship conflicts Confirmed continued focus now on her own independence Patient's feelings continued to be processed regarding family conflicts, prioritizing self care and protecting her family space Patient continues to maintain  personal boundaries in regards to family decisions that need to be made Patient confirmed continued efforts to set healthy boundaries and manage expectations Emotional support continued to be provided,  coping strategies explored, self care emphasized, ongoing mental health counseling encouraged   Patient Goals/Self-Care Activities Over the next 90 days, patient will:   - talk about feelings with a friend, family or spiritual advisor - practice positive thinking and self-talk  Follow up Plan: SW will follow up with patient by phone over the next 14-21 business days      The patient verbalized understanding of instructions, educational materials, and care plan provided today and declined offer to receive copy of patient  instructions, educational materials, and care plan.   Telephone follow up appointment with care management team member scheduled for: 09/18/21   Elliot Gurney, [place] Worker  [facility] [phone]

## 2021-09-04 NOTE — Chronic Care Management (AMB) (Signed)
Chronic Care Management    Clinical Social Work Note  09/04/2021 Name: Elizabeth Marks MRN: [id number] DOB: November 04, 1980  Elizabeth Marks is a 40 y.o. year old female who is a primary care patient of Gwyneth Sprout, FNP. The CCM team was consulted to assist the patient with chronic disease management and/or care coordination needs related to: Mental Health Counseling and Resources.   Engaged with patient by telephone for follow up visit in response to provider referral for social work chronic care management and care coordination services.   Consent to Services:  The patient was given information about Chronic Care Management services, agreed to services, and gave verbal consent prior to initiation of services.  Please see initial visit note for detailed documentation.   Patient agreed to services and consent obtained.   Assessment: Review of patient past medical history, allergies, medications, and health status, including review of relevant consultants reports was performed today as part of a comprehensive evaluation and provision of chronic care management and care coordination services.     SDOH (Social Determinants of Health) assessments and interventions performed:    Advanced Directives Status: Not addressed in this encounter.  CCM Care Plan  Allergies  Allergen Reactions   Corn-Containing Products     Abdominal pain   Shellfish Allergy     Abdominal pain   Adhesive [Tape] Rash   Latex Rash    Outpatient Encounter Medications as of 09/04/2021  Medication Sig   cabergoline (DOSTINEX) 0.5 MG tablet Take 0.5 tablets (0.25 mg total) by mouth once a week.   clindamycin (CLEOCIN T) 1 % lotion Apply 1 application topically daily.   escitalopram (LEXAPRO) 20 MG tablet Take 1 tablet (20 mg total) by mouth daily.   fluticasone (FLONASE) 50 MCG/ACT nasal spray Place into the nose.   levonorgestrel (MIRENA) 20 MCG/24HR IUD 1 each by Intrauterine route once.   triamcinolone cream  (KENALOG) 0.1 % Apply 1 application topically 2 (two) times daily.   No facility-administered encounter medications on file as of 09/04/2021.    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 05/18/2019   Galactorrhea 05/18/2019   Anxiety 04/07/2019   Bloating 04/07/2019    Conditions to be addressed/monitored: Anxiety; Mental Health Concerns   Care Plan : Anxiety (Adult)  Updates made by Vern Claude, LCSW since 09/04/2021 12:00 AM     Problem: Symptoms (Anxiety)      Goal: Anxiety Symptoms Monitored and Managed   Start Date: 11/07/2020  Expected End Date: 06/11/2021  This Visit's Progress: On track  Recent Progress: On track  Priority: High  Note:   Current Barriers:  Mental Health Concerns  Challenges with Anxiety Relationship conflicts  Clinical Social Work Clinical Goal(s):  Over the next 90 days, patient will follow up with a mental health therapist as directed by SW  Interventions:  1:1 collaboration with Trinna Post, PA-C regarding development and update of comprehensive plan of care as evidenced by provider attestation and co-signature Inter-disciplinary care team collaboration (see longitudinal plan of care) Follow up provided regarding  patient's challenges with anxiety and management of family relationship conflicts Confirmed continued focus now on her own independence Patient's feelings continued to be  processed regarding family conflicts, prioritizing self care and protecting her family space Patient continues to maintain  personal boundaries in regards to family decisions that need to be made Patient confirmed continued efforts to set healthy boundaries and manage  expectations Emotional support continued to be provided,  coping strategies explored, self care emphasized, ongoing mental health counseling encouraged   Patient Goals/Self-Care Activities Over the next 90 days, patient will:   - talk about feelings with a friend, family or spiritual advisor - practice positive thinking and self-talk  Follow up Plan: SW will follow up with patient by phone over the next 14-21 business days       Follow Up Plan: SW will follow up with patient by phone over the next 14 business days       [place], [place] Worker  [facility] [phone]

## 2021-10-09 ENCOUNTER — Ambulatory Visit: Payer: 59 | Admitting: *Deleted

## 2021-10-09 DIAGNOSIS — F419 Anxiety disorder, unspecified: Secondary | ICD-10-CM

## 2021-10-09 NOTE — Patient Instructions (Signed)
Visit Information  Thank you for taking time to visit with me today. Please don't hesitate to contact me if I can be of assistance to you before our next scheduled telephone appointment.  Following are the goals we discussed today:   - talk about feelings with a friend, family or spiritual advisor - practice positive thinking and self-talk  - continue to utilize positive coping strategies to manage anxiety  Our next appointment is by telephone on 11/06/21 at 11am  Please call the care guide team at [phone] if you need to cancel or reschedule your appointment.   If you are experiencing a Mental Health or [place] or need someone to talk to, please call the Suicide and Crisis Lifeline: 988   The patient verbalized understanding of instructions, educational materials, and care plan provided today and declined offer to receive copy of patient instructions, educational materials, and care plan.   Telephone follow up appointment with care management team member scheduled for: 11/06/21   Elliot Gurney, [place] Worker  [facility] [phone]

## 2021-10-09 NOTE — Chronic Care Management (AMB) (Signed)
Care Management Clinical Social Work Note  10/09/2021 Name: Elizabeth Marks MRN: [id number] DOB: 1981-07-03  Elizabeth Marks is a 40 y.o. year old female who is a primary care patient of Gwyneth Sprout, FNP.  The Care Management team was consulted for assistance with chronic disease management and coordination needs.  Engaged with patient by telephone for follow up visit in response to provider referral for social work chronic care management and care coordination services  Consent to Services:  Ms. Rozenberg was given information about Care Management services today including:  Care Management services includes personalized support from designated clinical staff supervised by her physician, including individualized plan of care and coordination with other care providers 24/7 contact phone numbers for assistance for urgent and routine care needs. The patient may stop case management services at any time by phone call to the office staff.  Patient agreed to services and consent obtained.   Assessment: Review of patient past medical history, allergies, medications, and health status, including review of relevant consultants reports was performed today as part of a comprehensive evaluation and provision of chronic care management and care coordination services.  SDOH (Social Determinants of Health) assessments and interventions performed:    Advanced Directives Status: Not addressed in this encounter.  Care Plan  Allergies  Allergen Reactions   Corn-Containing Products     Abdominal pain   Shellfish Allergy     Abdominal pain   Adhesive [Tape] Rash   Latex Rash    Outpatient Encounter Medications as of 10/09/2021  Medication Sig   cabergoline (DOSTINEX) 0.5 MG tablet Take 0.5 tablets (0.25 mg total) by mouth once a week.   clindamycin (CLEOCIN T) 1 % lotion Apply 1 application topically daily.   escitalopram (LEXAPRO) 20 MG tablet Take 1 tablet (20 mg total) by mouth daily.    fluticasone (FLONASE) 50 MCG/ACT nasal spray Place into the nose.   levonorgestrel (MIRENA) 20 MCG/24HR IUD 1 each by Intrauterine route once.   triamcinolone cream (KENALOG) 0.1 % Apply 1 application topically 2 (two) times daily.   No facility-administered encounter medications on file as of 10/09/2021.    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 05/18/2019   Galactorrhea 05/18/2019   Anxiety 04/07/2019   Bloating 04/07/2019    Conditions to be addressed/monitored: Anxiety; Mental Health Concerns   Care Plan : Anxiety (Adult)  Updates made by Vern Claude, LCSW since 10/09/2021 12:00 AM     Problem: Symptoms (Anxiety)      Goal: Anxiety Symptoms Monitored and Managed   Start Date: 11/07/2020  Expected End Date: 06/11/2021  This Visit's Progress: On track  Recent Progress: On track  Priority: High  Note:   Current Barriers:  Mental Health Concerns  Challenges with Anxiety Relationship conflicts  Clinical Social Work Clinical Goal(s):  Over the next 90 days, patient will follow up with a mental health therapist as directed by SW  Interventions:  1:1 collaboration with Gwyneth Sprout, FNP regarding development and update of comprehensive plan of care as evidenced by provider attestation and co-signature Inter-disciplinary care team collaboration (see longitudinal plan of care) Follow up provided regarding  patient's challenges with anxiety and management of  family relationship conflicts Confirmed continued focus now on her own independence-anxiety management improving Patient's feelings continued to be processed regarding family conflicts, prioritizing boundary setting and self care Patient  confirmed continued efforts to set healthy boundaries and manage expectations Emotional support continued to be provided,  coping strategies explored, self care emphasized, ongoing mental health counseling encouraged   Patient Goals/Self-Care Activities Over the next 90 days, patient will:   - talk about feelings with a friend, family or spiritual advisor - practice positive thinking and self-talk  Follow up Plan: SW will follow up with patient by phone over the next 14-21 business days       Follow Up Plan: SW will follow up with patient by phone over the next 30 business days   [place], [place] Worker  [facility] [phone]

## 2021-11-06 ENCOUNTER — Ambulatory Visit: Payer: 59 | Admitting: *Deleted

## 2021-11-06 NOTE — Patient Instructions (Signed)
Visit Information  Thank you for taking time to visit with me today. Please don't hesitate to contact me if I can be of assistance to you before our next scheduled telephone appointment.  Following are the goals we discussed today:   - talk about feelings with a friend, family or spiritual advisor - practice positive thinking and self-talk  - continue to utilize positive coping strategies to manage anxiety  Our next appointment is by telephone on 11/13/21 at 2pm  Please call the care guide team at [phone] if you need to cancel or reschedule your appointment.   If you are experiencing a Mental Health or [place] or need someone to talk to, please call the Suicide and Crisis Lifeline: 988   The patient verbalized understanding of instructions, educational materials, and care plan provided today and declined offer to receive copy of patient instructions, educational materials, and care plan.   Telephone follow up appointment with care management team member scheduled for: 11/13/21   Elliot Gurney, [place] Worker  [facility] [phone]

## 2021-11-06 NOTE — Chronic Care Management (AMB) (Signed)
Care Management Clinical Social Work Note  11/06/2021 Name: Elizabeth Marks MRN: [id number] DOB: 06-15-1981  Elizabeth Marks is a 41 y.o. year old female who is a primary care patient of Gwyneth Sprout, FNP.  The Care Management team was consulted for assistance with chronic disease management and coordination needs.  Engaged with patient by telephone for follow up visit in response to provider referral for social work chronic care management and care coordination services  Consent to Services:  Elizabeth Marks was given information about Care Management services today including:  Care Management services includes personalized support from designated clinical staff supervised by her physician, including individualized plan of care and coordination with other care providers 24/7 contact phone numbers for assistance for urgent and routine care needs. The patient may stop case management services at any time by phone call to the office staff.  Patient agreed to services and consent obtained.   Assessment: Review of patient past medical history, allergies, medications, and health status, including review of relevant consultants reports was performed today as part of a comprehensive evaluation and provision of chronic care management and care coordination services.  SDOH (Social Determinants of Health) assessments and interventions performed:    Advanced Directives Status: Not addressed in this encounter.  Care Plan  Allergies  Allergen Reactions   Corn-Containing Products     Abdominal pain   Shellfish Allergy     Abdominal pain   Adhesive [Tape] Rash   Latex Rash    Outpatient Encounter Medications as of 11/06/2021  Medication Sig   cabergoline (DOSTINEX) 0.5 MG tablet Take 0.5 tablets (0.25 mg total) by mouth once a week.   clindamycin (CLEOCIN T) 1 % lotion Apply 1 application topically daily.   escitalopram (LEXAPRO) 20 MG tablet Take 1 tablet (20 mg total) by mouth daily.    fluticasone (FLONASE) 50 MCG/ACT nasal spray Place into the nose.   levonorgestrel (MIRENA) 20 MCG/24HR IUD 1 each by Intrauterine route once.   triamcinolone cream (KENALOG) 0.1 % Apply 1 application topically 2 (two) times daily.   No facility-administered encounter medications on file as of 11/06/2021.    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 05/18/2019   Galactorrhea 05/18/2019   Anxiety 04/07/2019   Bloating 04/07/2019    Conditions to be addressed/monitored: Anxiety; Mental Health Concerns   Care Plan : Anxiety (Adult)  Updates made by Vern Claude, LCSW since 11/06/2021 12:00 AM     Problem: Symptoms (Anxiety)      Goal: Anxiety Symptoms Monitored and Managed   Start Date: 11/07/2020  Expected End Date: 06/11/2021  Recent Progress: On track  Priority: High  Note:   Current Barriers:  Mental Health Concerns  Challenges with Anxiety Relationship conflicts  Clinical Social Work Clinical Goal(s):  Over the next 90 days, patient will follow up with a mental health therapist as directed by SW  Interventions:  1:1 collaboration with Gwyneth Sprout, FNP regarding development and update of comprehensive plan of care as evidenced by provider attestation and co-signature Inter-disciplinary care team collaboration (see longitudinal plan of care) Follow up provided regarding  patient's challenges with anxiety and management of family relationship conflicts Confirmed grief response  due to recent loss of a family friend Patient's feelings briefly processed regarding loss-patient requested a call at another time due to illness Emotional support provided appointment rescheduled for 11/12/21   Patient  Goals/Self-Care Activities Over the next 90 days, patient will:   - talk about feelings with a friend, family or spiritual advisor - practice positive thinking and self-talk  Follow up Plan: SW will follow up with patient by phone over the next 14-21 business days       Follow Up Plan: Appointment scheduled for SW follow up with client by phone on:  11/12/21   Elliot Gurney, [place] Worker  [facility] [phone]

## 2021-11-13 ENCOUNTER — Ambulatory Visit: Payer: Self-pay | Admitting: *Deleted

## 2021-11-13 DIAGNOSIS — F419 Anxiety disorder, unspecified: Secondary | ICD-10-CM

## 2021-11-13 NOTE — Chronic Care Management (AMB) (Signed)
Care Management Clinical Social Work Note  11/13/2021 Name: Elizabeth Marks MRN: [id number] DOB: October 31, 1981  Elizabeth Marks is a 41 y.o. year old female who is a primary care patient of Gwyneth Sprout, FNP.  The Care Management team was consulted for assistance with chronic disease management and coordination needs.  Engaged with patient by telephone for follow up visit in response to provider referral for social work chronic care management and care coordination services  Consent to Services:  Elizabeth Marks was given information about Care Management services today including:  Care Management services includes personalized support from designated clinical staff supervised by her physician, including individualized plan of care and coordination with other care providers 24/7 contact phone numbers for assistance for urgent and routine care needs. The patient may stop case management services at any time by phone call to the office staff.  Patient agreed to services and consent obtained.   Assessment: Review of patient past medical history, allergies, medications, and health status, including review of relevant consultants reports was performed today as part of a comprehensive evaluation and provision of chronic care management and care coordination services.  SDOH (Social Determinants of Health) assessments and interventions performed:    Advanced Directives Status: Not addressed in this encounter.  Care Plan  Allergies  Allergen Reactions   Corn-Containing Products     Abdominal pain   Shellfish Allergy     Abdominal pain   Adhesive [Tape] Rash   Latex Rash    Outpatient Encounter Medications as of 11/13/2021  Medication Sig   cabergoline (DOSTINEX) 0.5 MG tablet Take 0.5 tablets (0.25 mg total) by mouth once a week.   clindamycin (CLEOCIN T) 1 % lotion Apply 1 application topically daily.   escitalopram (LEXAPRO) 20 MG tablet Take 1 tablet (20 mg total) by mouth daily.    fluticasone (FLONASE) 50 MCG/ACT nasal spray Place into the nose.   levonorgestrel (MIRENA) 20 MCG/24HR IUD 1 each by Intrauterine route once.   triamcinolone cream (KENALOG) 0.1 % Apply 1 application topically 2 (two) times daily.   No facility-administered encounter medications on file as of 11/13/2021.    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 05/18/2019   Galactorrhea 05/18/2019   Anxiety 04/07/2019   Bloating 04/07/2019    Conditions to be addressed/monitored: Anxiety; Mental Health Concerns   Care Plan : Anxiety (Adult)  Updates made by Vern Claude, LCSW since 11/13/2021 12:00 AM     Problem: Symptoms (Anxiety)      Goal: Anxiety Symptoms Monitored and Managed   Start Date: 11/07/2020  Expected End Date: 06/11/2021  Recent Progress: On track  Priority: High  Note:   Current Barriers: off and on anxiety, taking on everyone else's problems Mental Health Concerns  Challenges with Anxiety Relationship conflicts  Clinical Social Work Clinical Goal(s):  Over the next 90 days, patient will follow up with a mental health therapist as directed by SW  Interventions:  1:1 collaboration with Gwyneth Sprout, FNP regarding development and update of comprehensive plan of care as evidenced by provider attestation and co-signature Inter-disciplinary care team collaboration (see longitudinal plan of care) Follow up provided regarding  patient's challenges with anxiety and  management of family relationship conflicts Confirmed grief response due to recent loss of a family friend-still processing death of her family friend Patient's feelings processed regarding loss as well as overall stress management-per  patient anxiety "comes and goes"  Discussed recommendation for referral for ongoing mental health treatment and requested that patient verify her insurance coverage so that the appropriate arrangements can be made Emotional support continues to be provided, positive coping mechanisms reinforced   Patient Goals/Self-Care Activities Over the next 90 days, patient will:   - talk about feelings with a friend, family or spiritual advisor - practice positive thinking and self-talk  Follow up Plan: SW will follow up with patient by phone over the next 14-21 business days       Follow Up Plan: SW will follow up with patient by phone over the next 14 business days to confirm insurance coverage for appropriate referrals for ongoing mental health counseling   Elliot Gurney, [place] Worker  [facility] [phone]

## 2021-11-13 NOTE — Patient Instructions (Signed)
Visit Information ° °Thank you for taking time to visit with me today. Please don't hesitate to contact me if I can be of assistance to you before our next scheduled telephone appointment. ° °Following are the goals we discussed today:  °alk about feelings with a friend, family or spiritual advisor °- practice positive thinking and self-talk  °- continue to utilize positive coping strategies to manage anxiety °-confirm insurance coverage so that long term counseling can be arranged ° °Our next appointment is by telephone on 12/03/21 at 11 ° °Please call the care guide team at [phone] if you need to cancel or reschedule your appointment.  ° °If you are experiencing a Mental Health or Behavioral Health Crisis or need someone to talk to, please call the Suicide and Crisis Lifeline: 988  ° °The patient verbalized understanding of instructions, educational materials, and care plan provided today and declined offer to receive copy of patient instructions, educational materials, and care plan.  ° °Telephone follow up appointment with care management team member scheduled for: 12/04/22 ° ° °Chrystal [place], LCSW °Clinical Social Worker  °[facility] °[phone] ° °

## 2021-12-03 ENCOUNTER — Telehealth: Payer: Self-pay

## 2021-12-04 ENCOUNTER — Telehealth: Payer: Self-pay | Admitting: *Deleted

## 2021-12-04 ENCOUNTER — Telehealth: Payer: Self-pay

## 2021-12-04 NOTE — Telephone Encounter (Signed)
°  Care Management   Follow Up Note   12/04/2021 Name: Elizabeth Marks MRN: [id number] DOB: 03/09/1981   Referred by: Gwyneth Sprout, FNP Reason for referral : Care Coordination   An unsuccessful telephone outreach was attempted today. The patient was referred to the case management team for assistance with care management and care coordination.   Follow Up Plan: Telephone follow up appointment with care management team member scheduled for: 12/18/21   Elliot Gurney, [place] Worker  [facility] [phone]

## 2021-12-18 ENCOUNTER — Ambulatory Visit: Payer: Self-pay | Admitting: *Deleted

## 2021-12-18 DIAGNOSIS — F419 Anxiety disorder, unspecified: Secondary | ICD-10-CM

## 2021-12-18 NOTE — Chronic Care Management (AMB) (Signed)
Care Management Clinical Social Work Note  12/18/2021 Name: Elizabeth Marks MRN: [id number] DOB: 1981-09-04  Elizabeth Marks is a 41 y.o. year old female who is a primary care patient of Gwyneth Sprout, FNP.  The Care Management team was consulted for assistance with chronic disease management and coordination needs.  Engaged with patient by telephone for follow up visit in response to provider referral for social work chronic care management and care coordination services  Consent to Services:  Elizabeth Marks was given information about Care Management services today including:  Care Management services includes personalized support from designated clinical staff supervised by her physician, including individualized plan of care and coordination with other care providers 24/7 contact phone numbers for assistance for urgent and routine care needs. The patient may stop case management services at any time by phone call to the office staff.  Patient agreed to services and consent obtained.   Assessment: Review of patient past medical history, allergies, medications, and health status, including review of relevant consultants reports was performed today as part of a comprehensive evaluation and provision of chronic care management and care coordination services.  SDOH (Social Determinants of Health) assessments and interventions performed:    Advanced Directives Status: Not addressed in this encounter.  Care Plan  Allergies  Allergen Reactions   Corn-Containing Products     Abdominal pain   Shellfish Allergy     Abdominal pain   Adhesive [Tape] Rash   Latex Rash    Outpatient Encounter Medications as of 12/18/2021  Medication Sig   cabergoline (DOSTINEX) 0.5 MG tablet Take 0.5 tablets (0.25 mg total) by mouth once a week.   clindamycin (CLEOCIN T) 1 % lotion Apply 1 application topically daily.   escitalopram (LEXAPRO) 20 MG tablet Take 1 tablet (20 mg total) by mouth daily.    fluticasone (FLONASE) 50 MCG/ACT nasal spray Place into the nose.   levonorgestrel (MIRENA) 20 MCG/24HR IUD 1 each by Intrauterine route once.   triamcinolone cream (KENALOG) 0.1 % Apply 1 application topically 2 (two) times daily.   No facility-administered encounter medications on file as of 12/18/2021.    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 05/18/2019   Galactorrhea 05/18/2019   Anxiety 04/07/2019   Bloating 04/07/2019    Conditions to be addressed/monitored: Anxiety; Mental Health Concerns   Care Plan : Anxiety (Adult)  Updates made by Vern Claude, LCSW since 12/18/2021 12:00 AM     Problem: Symptoms (Anxiety)      Goal: Anxiety Symptoms Monitored and Managed   Start Date: 11/07/2020  Expected End Date: 06/11/2021  Recent Progress: On track  Priority: High  Note:   Current Barriers: off and on anxiety, taking on everyone else's problems Mental Health Concerns  Challenges with Anxiety Relationship conflicts  Clinical Social Work Clinical Goal(s):  Over the next 90 days, patient will follow up with a mental health therapist as directed by SW  Interventions:  1:1 collaboration with Gwyneth Sprout, FNP regarding development and update of comprehensive plan of care as evidenced by provider attestation and co-signature Inter-disciplinary care team collaboration (see longitudinal plan of care) Follow up provided regarding  patient's challenges with anxiety and  management of family relationship conflicts Patient states that "things are beginning to work out" financial picture has changed, patient now being compensated for care provided to family member, son also receiving help that he  needs Emotional support continues to be provided, positive coping mechanisms reinforced Patient encouraged to contact this Education officer, museum with any additional community resource or mental health needs   Patient Goals/Self-Care Activities Over the next 90 days, patient will:   - talk about feelings with a friend, family or spiritual advisor - practice positive thinking and self-talk -patient to confirm current insurance coverage  Follow up Plan: SW will follow up with patient by phone over the next 14-21 business days       Follow Up Plan: Client will contact this Education officer, museum with any additional community/mental heath needs   Elliot Gurney, [place] Worker  [facility] [phone]

## 2021-12-18 NOTE — Patient Instructions (Signed)
Visit Information  Thank you for taking time to visit with me today. Please don't hesitate to contact me if I can be of assistance to you before our next scheduled telephone appointment.  Following are the goals we discussed today:   - talk about feelings with a friend, family or spiritual advisor - practice positive thinking and self-talk  - continue to utilize positive coping strategies to manage anxiety -continue to consider ongoing counseling if needed in the future  If you are experiencing a Mental Health or [place] or need someone to talk to, please call the Suicide and Crisis Lifeline: 988   Patient verbalizes understanding of instructions and care plan provided today and agrees to view in [place]. Active MyChart status confirmed with patient.    No further follow up required: patient to contact this Education officer, museum with any additional community resource needs   [employer], [place] Worker  [facility] [phone]

## 2022-05-26 ENCOUNTER — Encounter: Payer: Self-pay | Admitting: Obstetrics and Gynecology
